# Patient Record
Sex: Male | Born: 1945 | Race: White | Hispanic: No | Marital: Married | Smoking: Former smoker
Health system: Southern US, Community
[De-identification: ages and names within clinical notes are randomized; demographics above are authoritative.]

## PROBLEM LIST (undated history)

## (undated) DIAGNOSIS — C61 Malignant neoplasm of prostate: Secondary | ICD-10-CM

## (undated) DIAGNOSIS — I499 Cardiac arrhythmia, unspecified: Secondary | ICD-10-CM

## (undated) DIAGNOSIS — IMO0002 Reserved for concepts with insufficient information to code with codable children: Secondary | ICD-10-CM

## (undated) DIAGNOSIS — E785 Hyperlipidemia, unspecified: Secondary | ICD-10-CM

## (undated) DIAGNOSIS — K219 Gastro-esophageal reflux disease without esophagitis: Secondary | ICD-10-CM

## (undated) DIAGNOSIS — I10 Essential (primary) hypertension: Secondary | ICD-10-CM

## (undated) DIAGNOSIS — F329 Major depressive disorder, single episode, unspecified: Secondary | ICD-10-CM

## (undated) DIAGNOSIS — F32A Depression, unspecified: Secondary | ICD-10-CM

## (undated) HISTORY — DX: Essential (primary) hypertension: I10

## (undated) HISTORY — PX: PROSTATE BIOPSY: SHX241

## (undated) HISTORY — DX: Gastro-esophageal reflux disease without esophagitis: K21.9

## (undated) HISTORY — DX: Cardiac arrhythmia, unspecified: I49.9

## (undated) HISTORY — DX: Hyperlipidemia, unspecified: E78.5

## (undated) HISTORY — DX: Depression, unspecified: F32.A

## (undated) HISTORY — PX: VASECTOMY: SHX75

## (undated) HISTORY — DX: Major depressive disorder, single episode, unspecified: F32.9

## (undated) HISTORY — DX: Reserved for concepts with insufficient information to code with codable children: IMO0002

---

## 1995-03-07 DIAGNOSIS — K251 Acute gastric ulcer with perforation: Secondary | ICD-10-CM | POA: Insufficient documentation

## 1995-03-07 HISTORY — PX: LAPAROSCOPIC GASTROTOMY W/ REPAIR OF ULCER: SUR772

## 1998-03-06 DIAGNOSIS — Z9889 Other specified postprocedural states: Secondary | ICD-10-CM | POA: Insufficient documentation

## 1999-03-07 HISTORY — PX: HERNIA REPAIR: SHX51

## 2000-02-20 ENCOUNTER — Ambulatory Visit (HOSPITAL_COMMUNITY): Admission: RE | Admit: 2000-02-20 | Discharge: 2000-02-20 | Payer: Self-pay | Admitting: Cardiology

## 2000-02-20 ENCOUNTER — Encounter: Payer: Self-pay | Admitting: Cardiology

## 2000-03-27 ENCOUNTER — Encounter: Payer: Self-pay | Admitting: General Surgery

## 2000-03-28 ENCOUNTER — Encounter (INDEPENDENT_AMBULATORY_CARE_PROVIDER_SITE_OTHER): Payer: Self-pay

## 2000-03-28 ENCOUNTER — Ambulatory Visit (HOSPITAL_COMMUNITY): Admission: RE | Admit: 2000-03-28 | Discharge: 2000-03-28 | Payer: Self-pay | Admitting: General Surgery

## 2003-02-05 ENCOUNTER — Ambulatory Visit (HOSPITAL_COMMUNITY): Admission: RE | Admit: 2003-02-05 | Discharge: 2003-02-05 | Payer: Self-pay | Admitting: Gastroenterology

## 2005-05-29 ENCOUNTER — Ambulatory Visit (HOSPITAL_COMMUNITY): Admission: RE | Admit: 2005-05-29 | Discharge: 2005-05-29 | Payer: Self-pay | Admitting: Cardiology

## 2009-03-06 DIAGNOSIS — K319 Disease of stomach and duodenum, unspecified: Secondary | ICD-10-CM | POA: Insufficient documentation

## 2009-04-30 ENCOUNTER — Ambulatory Visit (HOSPITAL_COMMUNITY): Admission: RE | Admit: 2009-04-30 | Discharge: 2009-04-30 | Payer: Self-pay | Admitting: Gastroenterology

## 2010-04-29 ENCOUNTER — Ambulatory Visit (HOSPITAL_COMMUNITY)
Admission: RE | Admit: 2010-04-29 | Discharge: 2010-04-29 | Disposition: A | Discharge status: Home / Self Care | Payer: Medicare Other | Source: Ambulatory Visit | Attending: Gastroenterology | Admitting: Gastroenterology

## 2010-04-29 ENCOUNTER — Other Ambulatory Visit: Payer: Self-pay | Admitting: Gastroenterology

## 2010-04-29 DIAGNOSIS — K319 Disease of stomach and duodenum, unspecified: Secondary | ICD-10-CM | POA: Insufficient documentation

## 2010-04-29 DIAGNOSIS — E785 Hyperlipidemia, unspecified: Secondary | ICD-10-CM | POA: Insufficient documentation

## 2010-04-29 DIAGNOSIS — Z800 Family history of malignant neoplasm of digestive organs: Secondary | ICD-10-CM | POA: Insufficient documentation

## 2010-04-29 DIAGNOSIS — R1013 Epigastric pain: Secondary | ICD-10-CM | POA: Insufficient documentation

## 2010-04-29 DIAGNOSIS — I10 Essential (primary) hypertension: Secondary | ICD-10-CM | POA: Insufficient documentation

## 2010-04-29 DIAGNOSIS — K219 Gastro-esophageal reflux disease without esophagitis: Secondary | ICD-10-CM | POA: Insufficient documentation

## 2010-07-22 NOTE — Op Note (Signed)
[facility]  Patient:    [name]                        MRN: [id number] Proc. Date: 03/28/00 Attending:  Chevis Pretty, M.D.                           Operative Report  PREOPERATIVE DIAGNOSIS:  Right inguinal hernia.  POSTOPERATIVE DIAGNOSIS:  Right combined direct and indirect inguinal hernia.  OPERATION:  Right inguinal hernia repair with mesh.  SURGEON:  Chevis Pretty, M.D.  ANESTHESIA:  General endotracheal anesthesia.  DESCRIPTION OF PROCEDURE:  After informed consent was obtained, the patient was brought to the operating room and placed in supine position on the operating table.  After adequate induction of general anesthesia, the patients abdomen and right groin were prepped with Betadine and draped in the usual sterile manner.  An incision was made from the edge of the pubic tubercle towards the anterior superior iliac spine for a distance of approximately 7 or 8 cm.  This incision was carried down through the rest of the skin and subcutaneous tissue with the Bovie electrocautery until the fascia of the anterior abdominal wall was encountered.  Once small vein was encountered in the subcutaneous tissue that required serially clamping with hemostats, cutting, and ligating with 3-0 silk ties.  The anterior surface of the external oblique muscle aponeurosis was cleared of any subcutaneous attachments, and the external oblique layer was incised along its fibers with the #15 blade knife.  A pair of Metzenbaum scissors were inserted underneath the fascial layer to ensure that the ilioinguinal nerve was not adherent to this layer.  Then the external oblique was opened to the apex of the external ring.  The cord was bluntly cleared from any attachments to the external oblique layer, and the ilioinguinal nerve was separated from the cord structures and held out of the working field.  The cord was the surrounded at the pubic tubercle between two  fingers, and a 1/4 inch Penrose as placed around the cord structures for retraction purposes.  There was an obvious defect of the floor of the canal through which some preperitoneal fat was bulging.  The cord structures were identified and skeletonized.  There was also an indirect small hernia sac with the cord that was separated from the rest of the cord structures.  Care was taken to stay away from the vas deferens.  The indirect hernia sac was opened, and no visceral components were within it.  It was then ligated near its base with 2-0 silk suture ligature and dropped back in the abdomen.  The preperitoneal space through the floor of the canal was then dissected in a blunt manner with some finger dissection as well as by inserting an sponge into the space to open up enough area for a piece of mesh.  A two-layer piece of Prolene mesh was then inserted with the deeper layer being placed in the preperitoneal space spread out in a radial manner.  Care was taken to make sure that this deeper layer of Prolene overlapped the pubic tubercle medially.  The superficial layer of mesh was then sewed inferiorly to the shaven edge of the inguinal ligament using a running 2-0 Prolene stitch.  Superiorly, the superficial layer of mesh was sewn to the aponeurotic muscular layer of the transversalis with another running 2-0 Prolene.  The lateral portion of the mesh  was cut in a longitudinal manner, and the tails were placed around the cord structures and lateral.  The tails were anchored to the shaven edge of the inguinal ligament.  Care was taken to make sure the opening for the cord was not too tight.  The ilioinguinal nerve was placed on top of the mesh and cord.  The external oblique was then reapproximated with a running 2-0 Prolene stitch, taking care not to catch the nerve within the stitch.  The wound was then irrigated with copious amounts of saline.  The Camper fascia was then reapproximated  with several 3-0 Vicryl interrupted stitches, and the skin was closed with 4-0 Monocryl subcuticular stitches.  Benzoin and Steri-Strips were applied, and a sterile dressing was placed on top of this.  The patient tolerated the procedure well.  At the end of the case, all needle, sponge, and instrument counts were correct.  The patient was awakened and taken to the recovery room in stable condition. DD:  03/28/00 TD:  03/28/00 Job: [id number]

## 2010-07-22 NOTE — Op Note (Signed)
NAME:  [name], [name]                           ACCOUNT NO.:  [id number]   MEDICAL RECORD NO.:  [id number]                   PATIENT TYPE:  AMB   LOCATION:  ENDO                                 FACILITY:   PHYSICIAN:  Anselmo Rod, M.D.               DATE OF BIRTH:  [birth year]   DATE OF PROCEDURE:  02/05/2003  DATE OF DISCHARGE:                                 OPERATIVE REPORT   PROCEDURE PERFORMED:  Screening colonoscopy.   ENDOSCOPIST:  Anselmo Rod, M.D.   INSTRUMENT USED:  Olympus video colonoscope.   INDICATIONS FOR PROCEDURE:  A 65 year old white male with a family history  of colon cancer in a paternal grandmother to rule out colonic polyps,  masses, etc.   PREPROCEDURE PREPARATION:  Informed consent was procured from the patient.  The patient was fasted for eight hours prior to procedure and prepped with a  bottle of magnesium citrate and gallon of GoLYTELY the night prior to  procedure.   PREPROCEDURE PHYSICAL:  VITAL SIGNS:  The patient has stable vital signs.  NECK:  Supple.  CHEST:  Clear to auscultation.  CARDIAC:  S1, S2 regular.  ABDOMEN:  Soft with normal bowel sounds.   DESCRIPTION OF PROCEDURE:  The patient was placed in the left lateral  decubitus position and sedated with 60 mg of Demerol and 6 mg of Versed  intravenously. Once the patient was adequately sedated and maintained on low-  flow oxygen and continuous cardiac monitoring, the Olympus video colonoscope  was advanced from the rectum to the cecum.  The appendicular orifice and  ileocecal valve were visualized and photographed.  The terminal ileum  appeared normal and without lesions.  Small internal hemorrhoids were seen  on retroflexion.  No masses, polyps or diverticular were appreciated.   IMPRESSION:  1. Normal colonoscopy up to the terminal ileum except for internal     hemorrhoids.  2. Some residual stool in the colon, multiple washes done.   RECOMMENDATIONS:  1. Considering the  patient's family history of colon cancer, repeat     colonoscopy has been recommended in the next five years unless the     patient develops any abnormal symptoms in the interim.  2. Outpatient followup as it arises in the future.                                               Anselmo Rod, M.D.    JNM/MEDQ  D:  02/05/2003  T:  02/06/2003  Job:  [id number]   cc:   Gabriel Earing, M.D.  [address]  [place]  [place] [address]  Fax: [phone]

## 2010-09-22 ENCOUNTER — Encounter: Payer: Self-pay | Admitting: Family Medicine

## 2010-09-22 ENCOUNTER — Ambulatory Visit (INDEPENDENT_AMBULATORY_CARE_PROVIDER_SITE_OTHER): Payer: Medicare Other | Admitting: Family Medicine

## 2010-09-22 VITALS — BP 126/88 | HR 66 | Temp 98.3°F | Ht 72.75 in | Wt 207.6 lb

## 2010-09-22 DIAGNOSIS — Z0000 Encounter for general adult medical examination without abnormal findings: Secondary | ICD-10-CM | POA: Insufficient documentation

## 2010-09-22 DIAGNOSIS — E785 Hyperlipidemia, unspecified: Secondary | ICD-10-CM

## 2010-09-22 DIAGNOSIS — E049 Nontoxic goiter, unspecified: Secondary | ICD-10-CM

## 2010-09-22 DIAGNOSIS — Z23 Encounter for immunization: Secondary | ICD-10-CM

## 2010-09-22 DIAGNOSIS — R17 Unspecified jaundice: Secondary | ICD-10-CM

## 2010-09-22 DIAGNOSIS — I10 Essential (primary) hypertension: Secondary | ICD-10-CM

## 2010-09-22 DIAGNOSIS — K219 Gastro-esophageal reflux disease without esophagitis: Secondary | ICD-10-CM

## 2010-09-22 LAB — HEPATIC FUNCTION PANEL
ALT: 28 U/L (ref 0–53)
AST: 25 U/L (ref 0–37)
Bilirubin, Direct: 0.1 mg/dL (ref 0.0–0.3)
Total Bilirubin: 0.7 mg/dL (ref 0.3–1.2)

## 2010-09-22 LAB — BASIC METABOLIC PANEL
Chloride: 104 mEq/L (ref 96–112)
Creatinine, Ser: 1 mg/dL (ref 0.4–1.5)
Potassium: 4.3 mEq/L (ref 3.5–5.1)

## 2010-09-22 MED ORDER — ZOSTER VACCINE LIVE 19400 UNT/0.65ML ~~LOC~~ SOLR: 0.6500 mL | Freq: Once | SUBCUTANEOUS | Status: DC

## 2010-09-22 NOTE — Assessment & Plan Note (Signed)
ghm utd Labs reviewed with pt 

## 2010-09-22 NOTE — Progress Notes (Signed)
  Subjective:    Patient ID: [name], male    DOB: 1945-05-02, 65 y.o.   MRN: [id number]  HPI Pt is here for cpe.  No complaints.  Dr Sharyn Lull is his cardiologist and he rx most of his meds.  Dr Loreta Ave is his GI doctor and his colon was Dec 2011--he said it was fine but has them q5y.      Review of Systems .  Review of Systems  Constitutional: Negative for activity change, appetite change and fatigue.  HENT: Negative for hearing loss, congestion, tinnitus and ear discharge.  No dentist. Eyes: Negative for visual disturbance (optho -- 5 years ago) Respiratory: Negative for cough, chest tightness and shortness of breath.   Cardiovascular: Negative for chest pain, palpitations and leg swelling.  Gastrointestinal: Negative for abdominal pain, diarrhea, constipation and abdominal distention.  Genitourinary: Negative for urgency, frequency, decreased urine volume and difficulty urinating.  Musculoskeletal: Negative for back pain, arthralgias and gait problem.  Skin: Negative for color change, pallor and rash.  Neurological: Negative for dizziness, light-headedness, numbness and headaches.  Hematological: Negative for adenopathy. Does not bruise/bleed easily.  Psychiatric/Behavioral: Negative for suicidal ideas, confusion, sleep disturbance, self-injury, dysphoric mood, decreased concentration and agitation.  Pt is able to read and write and can do all ADLs No risk for falling No abuse/ violence in home   Objective:   Physical Exam BP 126/88  Pulse 66  Temp(Src) 98.3 F (36.8 C) (Oral)  Ht 6' 0.75" (1.848 m)  Wt 207 lb 9.6 oz (94.167 kg)  BMI 27.58 kg/m2  SpO2 97%  General Appearance:    Alert, cooperative, no distress, appears stated age  Head:    Normocephalic, without obvious abnormality, atraumatic  Eyes:    PERRL, conjunctiva/corneas clear, EOM's intact, fundi    benign, both eyes       Ears:    Normal TM's and external ear canals, both ears  Nose:   Nares normal, septum  midline, mucosa normal, no drainage   or sinus tenderness  Throat:   Lips, mucosa, and tongue normal; teeth and gums normal  Neck:   Supple, symmetrical, trachea midline, no adenopathy;       thyroid:  No enlargement/tenderness/nodules; no carotid   bruit or JVD  Back:     Symmetric, no curvature, ROM normal, no CVA tenderness  Lungs:     Clear to auscultation bilaterally, respirations unlabored  Chest wall:    No tenderness or deformity  Heart:    Regular rate and rhythm, S1 and S2 normal, no murmur, rub   or gallop  Abdomen:     Soft, non-tender, bowel sounds active all four quadrants,    no masses, no organomegaly  Genitalia:    Normal male without lesion, discharge or tenderness  Rectal:    Normal tone, normal prostate, no masses or tenderness;   guaiac negative stool  Extremities:   Extremities normal, atraumatic, no cyanosis or edema  Pulses:   2+ and symmetric all extremities  Skin:   Skin color, texture, turgor normal, no rashes or lesions  Lymph nodes:   Cervical, supraclavicular, and axillary nodes normal  Neurologic:   CNII-XII intact. Normal strength, sensation and reflexes      throughout          Assessment & Plan:

## 2010-09-22 NOTE — Assessment & Plan Note (Signed)
Per cardio 

## 2010-09-22 NOTE — Assessment & Plan Note (Signed)
I did not appreciated it but pt worried about it We will recheck hep function

## 2010-09-22 NOTE — Assessment & Plan Note (Signed)
con't dexilant Per GI

## 2010-09-22 NOTE — Patient Instructions (Signed)
Preventative Care for Adults - Male Studies show that half of deaths in the [place] today result from unhealthy lifestyle practices. This includes ignoring preventive care suggestions. Preventive health guidelines for women include the following key practices:  A routine yearly physical is a good way to check with your primary caregiver about your health and preventive screening. It is a chance to share any concerns and updates on your health, and to receive a thorough all-over exam.   If you smoke cigarettes, find out from your caregiver how to quit. It can literally save your life, no matter how long you have been a tobacco user. If you do not use tobacco, never start.   Maintain a healthy diet and normal weight. Increased weight leads to problems with blood pressure and diabetes. Decrease saturated fat in your diet and increase regular exercise. Eat a variety of foods, including fruit, vegetables, animal or vegetable protein (meat, fish, chicken, and eggs, or beans, lentils, and tofu), and grains, such as rice. Get information about proper diet from your caregiver, if needed.   Aerobic exercise helps maintain good heart health. The CDC and the American College of Sports Medicine recommend 30 minutes of moderate-intensity exercise (a brisk walk that increases your heart rate and breathing) on most days of the week. Ongoing high blood pressure should be treated with medicines, if weight loss and exercise are not effective.   Avoid smoking, drinking too much alcohol (more than two drinks per day), and use of street drugs. Do not share needles with anyone. Ask for professional help if you need support or instructions about stopping the use of alcohol, cigarettes, or drugs.   Maintain normal blood lipids and cholesterol, by minimizing your intake of saturated fat. Eat a well rounded diet, with plenty of fruit and vegetables. The National Institutes of Health encourage women to eat 5-9 servings of  fruit and vegetables each day. Your caregiver can give instructions to help you keep your risk of heart disease or stroke low. High blood pressure causes heart disease and increases risk of stroke. Blood pressure should be checked every 1-2 years, from age 20 onward.   Blood tests for high cholesterol, which causes heart and vessel disease, should begin at age 20 and be repeated every 5 years, if test results are normal. (Repeat tests more often if results are high.)   Diabetes screening involves taking a blood sample to check your blood sugar level, after a fasting period. This is done once every 3 years, after age 45, if test results are normal.   Breast cancer screening is essential to preventive care for women. All women age 20 and older should perform a breast self-exam every month. At age 40 and older, women should have their caregiver complete a breast exam each year. Women at ages 40-50 should have a mammogram (x-ray film) of the breasts each year. Your caregiver can discuss when to start your yearly mammograms.   Cervical cancer screening includes taking a Pap smear (sample of cells examined under a microscope) from the cervix (end of the uterus). It also includes testing for HPV (Human Papilloma Virus, which can cause cervical cancer). Screening and a pelvic exam should begin at age 21, or 3 years after a woman becomes sexually active. Screening should occur every year, with a Pap smear but no HPV testing, up to age 30. After age 30, you should have a Pap smear every 3 years with HPV testing, if no HPV was found previously.     Colon cancer can be detected, and often prevented, long before it is life threatening. Most routine colon cancer screening begins at the age of 50. On a yearly basis, your caregiver may provide easy-to-use take-home tests to check for hidden blood in the stool. Use of a small camera at the end of a tube, to directly examine the colon (sigmoidoscopy or colonoscopy), can  detect the earliest forms of colon cancer and can be life saving. Talk to your caregiver about this at age 50, when routine screening begins. (Screening is repeated every 5 years, unless early forms of pre-cancerous polyps or small growths are found.)   Practice safe sex. Use condoms. Condoms are used for birth control and to reduce the spread of sexually transmitted infections (STIs). Unsafe sex is sexual activity without the use of safeguards, such as condoms and avoidance of high-risk acts, to reduce the chances of getting or spreading STIs. STIs include gonorrhea (the clap), chlamydia, syphilis, trichimonas, herpes, HPV (human papilloma virus) and HIV (human immunodeficiency virus), which causes AIDS. Herpes, HIV, and HPV are viral illnesses that have no cure. They can result in disability, cancer, and death.   HPV causes cancer of the cervix, and other infections that can be transmitted from person to person. There is a vaccine for HPV, and females should get immunized between the ages of 11 and 26. It requires a series of 3 shots.   Osteoporosis is a disease in which the bones lose minerals and strength as we age. This can result in serious bone fractures. Risk of osteoporosis can be identified using a bone density scan. Women ages 65 and over should discuss this with their caregivers, as should women after menopause who have other risk factors. Ask your caregiver whether you should be taking a calcium supplement and Vitamin D, to reduce the rate of osteoporosis.   Menopause can be associated with physical symptoms and risks. Hormone replacement therapy is available to decrease these. You should talk to your caregiver about whether starting or continuing to take hormones is right for you.   Use sunscreen with SPF (skin protection factor) of 15 or more. Apply sunscreen liberally and repeatedly throughout the day. Being outside in the sun, when your shadow is shorter than you are, means you are being  exposed to sun at greater intensity. Lighter skinned people are at a greater risk of skin cancer. Wear sunglasses, to protect your eyes from too much damaging sunlight (which can speed up cataract formation).   Once a month, do a whole body skin exam or review, using a mirror to look at your back. Notify your caregiver of changes in moles, especially if there are changes in shapes, colors, irregular border, a size larger than a pencil eraser, or new moles develop.   Keep carbon monoxide and smoke detectors in your home, and functioning, at all times. Change the batteries every 6 months, or use a model that plugs into the wall.   Stay up to date with your tetanus shots and other required immunizations. A booster for tetanus should be given every 10 years. Be sure to get your flu shot every year, since 5%-20% of the [place] population comes down with the flu. The composition of the flu vaccine changes each year, so being vaccinated once is not enough. Get your shot in the fall, before the flu season peaks. The table below lists important vaccines to get. Other vaccines to consider include for Hepatitis A virus (to prevent a form of   infection of the liver, by a virus acquired from food), Varicella Zoster (a virus that causes shingles), and Meninogoccal (against bacteria which cause a form of meningitis).   Brush your teeth twice a day with fluoride toothpaste, and floss once a day. Good oral hygiene prevents tooth decay and gum disease, which can be painful and can cause other health problems. Visit your dentist for a routine oral and dental check up and preventive care every 6-12 months.   The Body Mass Index or BMI is a way of measuring how much of your body is fat. Having a BMI above 27 increases the risk of heart disease, diabetes, hypertension, stroke and other problems related to obesity. Your caregiver can help determine your BMI, and can develop an exercise and dietary program to help you achieve or  maintain this measurement at a healthy level.   Wear seat belts whenever you are in a vehicle, whether as passenger or driver, and even for short drives of a few minutes.   If you bicycle, wear a helmet at all times.  Preventative Care for Adult Women  Preventative Services Ages 19-39 Ages 40-64 Ages 65 and over  Health risk assessment and lifestyle counseling.     Blood pressure check.** Every 1-2 years Every 1-2 years Every 1-2 years  Total cholesterol check including HDL.** Every 5 years beginning at age 35 Every 5 years beginning at age 20, or more often if risk is high Every 5 years through age 75, then optional  Breast self exam. Monthly in all women ages 20 and older Monthly Monthly  Clinical breast exam.** Every 3 years beginning at age 20 Every year Every year  Mammogram.**  Every year beginning at age 50, optional from age 40-49 (discuss with your caregiver). Every year until age 75, then optional  Pap Smear** and HPV Screening. Every year from ages 21 through 29 Every 3 years from ages 30 through 65, if HPV is negative Optional; talk with your caregiver  Flexible sigmoidoscopy** or colonoscopy.**   Every 5 years beginning at age 50 Every 5 years until age 80; then optional  FOBT (fecal occult blood test) of stool.  Every year beginning at age 50 Every year until 80; then optional  Skin self-exam. Monthly Monthly Monthly  Tetanus-diphtheria (Td) immunization. Every 10 years Every 10 years Every 10 years  Influenza immunization.** Every year Every year Every year  HPV immunization. Once between the ages of 11 and 26     Pneumococcal immunization.** Optional Optional Every 5 years  Hepatitis B immunization.** Series of 3 immunizations  (if not done previously, usually given at 0, 1 to 2, and 4 to 6 months)  Check with your caregiver, if vaccination not previously given Check with your caregiver, if vaccination not previously given  ** Family history and personal history of risk and  conditions may change your caregiver's recommendations.  Document Released: 04/18/2001 Document Re-Released: 05/17/2009 [employer] Patient Information 2011 [employer]. 

## 2011-03-28 ENCOUNTER — Other Ambulatory Visit: Payer: Self-pay | Admitting: Cardiology

## 2012-02-21 ENCOUNTER — Ambulatory Visit (INDEPENDENT_AMBULATORY_CARE_PROVIDER_SITE_OTHER): Payer: Medicare Other | Admitting: Family Medicine

## 2012-02-21 ENCOUNTER — Encounter: Payer: Self-pay | Admitting: Family Medicine

## 2012-02-21 VITALS — BP 122/80 | HR 69 | Temp 98.3°F | Ht 72.0 in | Wt 196.4 lb

## 2012-02-21 DIAGNOSIS — F329 Major depressive disorder, single episode, unspecified: Secondary | ICD-10-CM

## 2012-02-21 DIAGNOSIS — E785 Hyperlipidemia, unspecified: Secondary | ICD-10-CM

## 2012-02-21 DIAGNOSIS — F4310 Post-traumatic stress disorder, unspecified: Secondary | ICD-10-CM

## 2012-02-21 DIAGNOSIS — N529 Male erectile dysfunction, unspecified: Secondary | ICD-10-CM

## 2012-02-21 DIAGNOSIS — Z0000 Encounter for general adult medical examination without abnormal findings: Secondary | ICD-10-CM

## 2012-02-21 DIAGNOSIS — F32A Depression, unspecified: Secondary | ICD-10-CM | POA: Insufficient documentation

## 2012-02-21 DIAGNOSIS — I10 Essential (primary) hypertension: Secondary | ICD-10-CM

## 2012-02-21 LAB — CBC WITH DIFFERENTIAL/PLATELET
Basophils Absolute: 0 10*3/uL (ref 0.0–0.1)
Basophils Relative: 0.2 % (ref 0.0–3.0)
Eosinophils Relative: 1.5 % (ref 0.0–5.0)
HCT: 45.7 % (ref 39.0–52.0)
Hemoglobin: 15.6 g/dL (ref 13.0–17.0)
Lymphs Abs: 1.7 10*3/uL (ref 0.7–4.0)
Monocytes Relative: 7.2 % (ref 3.0–12.0)
Neutro Abs: 3.2 10*3/uL (ref 1.4–7.7)
RDW: 12.7 % (ref 11.5–14.6)

## 2012-02-21 LAB — PSA: PSA: 2.18 ng/mL (ref 0.10–4.00)

## 2012-02-21 MED ORDER — TADALAFIL 5 MG PO TABS
ORAL_TABLET | ORAL | Status: DC
Start: 2012-02-21 — End: 2012-03-18

## 2012-02-21 MED ORDER — SERTRALINE HCL 50 MG PO TABS: 50.0000 mg | ORAL_TABLET | Freq: Every day | ORAL | Status: DC

## 2012-02-21 NOTE — Assessment & Plan Note (Signed)
zoloft 50 mg

## 2012-02-21 NOTE — Assessment & Plan Note (Signed)
Per cards  

## 2012-02-21 NOTE — Assessment & Plan Note (Signed)
zoloft rto 1 month

## 2012-02-21 NOTE — Patient Instructions (Signed)
Preventive Care for Adults, Male A healthy lifestyle and preventive care can promote health and wellness. Preventive health guidelines for men include the following key practices:  A routine yearly physical is a good way to check with your caregiver about your health and preventative screening. It is a chance to share any concerns and updates on your health, and to receive a thorough exam.  Visit your dentist for a routine exam and preventative care every 6 months. Brush your teeth twice a day and floss once a day. Good oral hygiene prevents tooth decay and gum disease.  The frequency of eye exams is based on your age, health, family medical history, use of contact lenses, and other factors. Follow your caregiver's recommendations for frequency of eye exams.  Eat a healthy diet. Foods like vegetables, fruits, whole grains, low-fat dairy products, and lean protein foods contain the nutrients you need without too many calories. Decrease your intake of foods high in solid fats, added sugars, and salt. Eat the right amount of calories for you.Get information about a proper diet from your caregiver, if necessary.  Regular physical exercise is one of the most important things you can do for your health. Most adults should get at least 150 minutes of moderate-intensity exercise (any activity that increases your heart rate and causes you to sweat) each week. In addition, most adults need muscle-strengthening exercises on 2 or more days a week.  Maintain a healthy weight. The body mass index (BMI) is a screening tool to identify possible weight problems. It provides an estimate of body fat based on height and weight. Your caregiver can help determine your BMI, and can help you achieve or maintain a healthy weight.For adults 20 years and older:  A BMI below 18.5 is considered underweight.  A BMI of 18.5 to 24.9 is normal.  A BMI of 25 to 29.9 is considered overweight.  A BMI of 30 and above is  considered obese.  Maintain normal blood lipids and cholesterol levels by exercising and minimizing your intake of saturated fat. Eat a balanced diet with plenty of fruit and vegetables. Blood tests for lipids and cholesterol should begin at age 20 and be repeated every 5 years. If your lipid or cholesterol levels are high, you are over 50, or you are a high risk for heart disease, you may need your cholesterol levels checked more frequently.Ongoing high lipid and cholesterol levels should be treated with medicines if diet and exercise are not effective.  If you smoke, find out from your caregiver how to quit. If you do not use tobacco, do not start.  If you choose to drink alcohol, do not exceed 2 drinks per day. One drink is considered to be 12 ounces (355 mL) of beer, 5 ounces (148 mL) of wine, or 1.5 ounces (44 mL) of liquor.  Avoid use of street drugs. Do not share needles with anyone. Ask for help if you need support or instructions about stopping the use of drugs.  High blood pressure causes heart disease and increases the risk of stroke. Your blood pressure should be checked at least every 1 to 2 years. Ongoing high blood pressure should be treated with medicines, if weight loss and exercise are not effective.  If you are 45 to 67 years old, ask your caregiver if you should take aspirin to prevent heart disease.  Diabetes screening involves taking a blood sample to check your fasting blood sugar level. This should be done once every 3 years,   after age 45, if you are within normal weight and without risk factors for diabetes. Testing should be considered at a younger age or be carried out more frequently if you are overweight and have at least 1 risk factor for diabetes.  Colorectal cancer can be detected and often prevented. Most routine colorectal cancer screening begins at the age of 50 and continues through age 75. However, your caregiver may recommend screening at an earlier age if you  have risk factors for colon cancer. On a yearly basis, your caregiver may provide home test kits to check for hidden blood in the stool. Use of a small camera at the end of a tube, to directly examine the colon (sigmoidoscopy or colonoscopy), can detect the earliest forms of colorectal cancer. Talk to your caregiver about this at age 50, when routine screening begins. Direct examination of the colon should be repeated every 5 to 10 years through age 75, unless early forms of pre-cancerous polyps or small growths are found.  Hepatitis C blood testing is recommended for all people born from 1945 through 1965 and any individual with known risks for hepatitis C.  Practice safe sex. Use condoms and avoid high-risk sexual practices to reduce the spread of sexually transmitted infections (STIs). STIs include gonorrhea, chlamydia, syphilis, trichomonas, herpes, HPV, and human immunodeficiency virus (HIV). Herpes, HIV, and HPV are viral illnesses that have no cure. They can result in disability, cancer, and death.  A one-time screening for abdominal aortic aneurysm (AAA) and surgical repair of large AAAs by sound wave imaging (ultrasonography) is recommended for ages 65 to 75 years who are current or former smokers.  Healthy men should no longer receive prostate-specific antigen (PSA) blood tests as part of routine cancer screening. Consult with your caregiver about prostate cancer screening.  Testicular cancer screening is not recommended for adult males who have no symptoms. Screening includes self-exam, caregiver exam, and other screening tests. Consult with your caregiver about any symptoms you have or any concerns you have about testicular cancer.  Use sunscreen with skin protection factor (SPF) of 30 or more. Apply sunscreen liberally and repeatedly throughout the day. You should seek shade when your shadow is shorter than you. Protect yourself by wearing long sleeves, pants, a wide-brimmed hat, and  sunglasses year round, whenever you are outdoors.  Once a month, do a whole body skin exam, using a mirror to look at the skin on your back. Notify your caregiver of new moles, moles that have irregular borders, moles that are larger than a pencil eraser, or moles that have changed in shape or color.  Stay current with required immunizations.  Influenza. You need a dose every fall (or winter). The composition of the flu vaccine changes each year, so being vaccinated once is not enough.  Pneumococcal polysaccharide. You need 1 to 2 doses if you smoke cigarettes or if you have certain chronic medical conditions. You need 1 dose at age 65 (or older) if you have never been vaccinated.  Tetanus, diphtheria, pertussis (Tdap, Td). Get 1 dose of Tdap vaccine if you are younger than age 65 years, are over 65 and have contact with an infant, are a healthcare worker, or simply want to be protected from whooping cough. After that, you need a Td booster dose every 10 years. Consult your caregiver if you have not had at least 3 tetanus and diphtheria-containing shots sometime in your life or have a deep or dirty wound.  HPV. This vaccine is recommended   for males 13 through 67 years of age. This vaccine may be given to men 22 through 67 years of age who have not completed the 3 dose series. It is recommended for men through age 26 who have sex with men or whose immune system is weakened because of HIV infection, other illness, or medications. The vaccine is given in 3 doses over 6 months.  Measles, mumps, rubella (MMR). You need at least 1 dose of MMR if you were born in 1957 or later. You may also need a 2nd dose.  Meningococcal. If you are age 19 to 21 years and a first-year college student living in a residence hall, or have one of several medical conditions, you need to get vaccinated against meningococcal disease. You may also need additional booster doses.  Zoster (shingles). If you are age 60 years or  older, you should get this vaccine.  Varicella (chickenpox). If you have never had chickenpox or you were vaccinated but received only 1 dose, talk to your caregiver to find out if you need this vaccine.  Hepatitis A. You need this vaccine if you have a specific risk factor for hepatitis A virus infection, or you simply wish to be protected from this disease. The vaccine is usually given as 2 doses, 6 to 18 months apart.  Hepatitis B. You need this vaccine if you have a specific risk factor for hepatitis B virus infection or you simply wish to be protected from this disease. The vaccine is given in 3 doses, usually over 6 months. Preventative Service / Frequency Ages 19 to 39  Blood pressure check.** / Every 1 to 2 years.  Lipid and cholesterol check.** / Every 5 years beginning at age 20.  Hepatitis C blood test.** / For any individual with known risks for hepatitis C.  Skin self-exam. / Monthly.  Influenza immunization.** / Every year.  Pneumococcal polysaccharide immunization.** / 1 to 2 doses if you smoke cigarettes or if you have certain chronic medical conditions.  Tetanus, diphtheria, pertussis (Tdap,Td) immunization. / A one-time dose of Tdap vaccine. After that, you need a Td booster dose every 10 years.  HPV immunization. / 3 doses over 6 months, if 26 and younger.  Measles, mumps, rubella (MMR) immunization. / You need at least 1 dose of MMR if you were born in 1957 or later. You may also need a 2nd dose.  Meningococcal immunization. / 1 dose if you are age 19 to 21 years and a first-year college student living in a residence hall, or have one of several medical conditions, you need to get vaccinated against meningococcal disease. You may also need additional booster doses.  Varicella immunization.** / Consult your caregiver.  Hepatitis A immunization.** / Consult your caregiver. 2 doses, 6 to 18 months apart.  Hepatitis B immunization.** / Consult your caregiver. 3 doses  usually over 6 months. Ages 40 to 64  Blood pressure check.** / Every 1 to 2 years.  Lipid and cholesterol check.** / Every 5 years beginning at age 20.  Fecal occult blood test (FOBT) of stool. / Every year beginning at age 50 and continuing until age 75. You may not have to do this test if you get colonoscopy every 10 years.  Flexible sigmoidoscopy** or colonoscopy.** / Every 5 years for a flexible sigmoidoscopy or every 10 years for a colonoscopy beginning at age 50 and continuing until age 75.  Hepatitis C blood test.** / For all people born from 1945 through 1965 and any   individual with known risks for hepatitis C.  Skin self-exam. / Monthly.  Influenza immunization.** / Every year.  Pneumococcal polysaccharide immunization.** / 1 to 2 doses if you smoke cigarettes or if you have certain chronic medical conditions.  Tetanus, diphtheria, pertussis (Tdap/Td) immunization.** / A one-time dose of Tdap vaccine. After that, you need a Td booster dose every 10 years.  Measles, mumps, rubella (MMR) immunization. / You need at least 1 dose of MMR if you were born in 1957 or later. You may also need a 2nd dose.  Varicella immunization.**/ Consult your caregiver.  Meningococcal immunization.** / Consult your caregiver.  Hepatitis A immunization.** / Consult your caregiver. 2 doses, 6 to 18 months apart.  Hepatitis B immunization.** / Consult your caregiver. 3 doses, usually over 6 months. Ages 65 and over  Blood pressure check.** / Every 1 to 2 years.  Lipid and cholesterol check.**/ Every 5 years beginning at age 20.  Fecal occult blood test (FOBT) of stool. / Every year beginning at age 50 and continuing until age 75. You may not have to do this test if you get colonoscopy every 10 years.  Flexible sigmoidoscopy** or colonoscopy.** / Every 5 years for a flexible sigmoidoscopy or every 10 years for a colonoscopy beginning at age 50 and continuing until age 75.  Hepatitis C blood  test.** / For all people born from 1945 through 1965 and any individual with known risks for hepatitis C.  Abdominal aortic aneurysm (AAA) screening.** / A one-time screening for ages 65 to 75 years who are current or former smokers.  Skin self-exam. / Monthly.  Influenza immunization.** / Every year.  Pneumococcal polysaccharide immunization.** / 1 dose at age 65 (or older) if you have never been vaccinated.  Tetanus, diphtheria, pertussis (Tdap, Td) immunization. / A one-time dose of Tdap vaccine if you are over 65 and have contact with an infant, are a healthcare worker, or simply want to be protected from whooping cough. After that, you need a Td booster dose every 10 years.  Varicella immunization. ** / Consult your caregiver.  Meningococcal immunization.** / Consult your caregiver.  Hepatitis A immunization. ** / Consult your caregiver. 2 doses, 6 to 18 months apart.  Hepatitis B immunization.** / Check with your caregiver. 3 doses, usually over 6 months. **Family history and personal history of risk and conditions may change your caregiver's recommendations. Document Released: 04/18/2001 Document Revised: 05/15/2011 Document Reviewed: 07/18/2010 [employer] Patient Information 2013 [employer].  

## 2012-02-21 NOTE — Progress Notes (Signed)
Subjective:    [name] is a 66 y.o. male who presents for Medicare Annual/Subsequent preventive examination.   Preventive Screening-Counseling & Management  Tobacco History  Smoking status  . Former Smoker  . Types: Cigarettes  . Quit date: 09/22/1970  Smokeless tobacco  . Never Used    Problems Prior to Visit 1. Depression--PTSD  Current Problems (verified) Patient Active Problem List  Diagnosis  . Icterus  . Preventative health care  . Hyperlipidemia  . HTN (hypertension)  . GERD (gastroesophageal reflux disease)    Medications Prior to Visit Current Outpatient Prescriptions on File Prior to Visit  Medication Sig Dispense Refill  . aspirin 81 MG tablet Take 81 mg by mouth daily.        Marland Kitchen atorvastatin (LIPITOR) 40 MG tablet Take 40 mg by mouth daily.        . fish oil-omega-3 fatty acids 1000 MG capsule Take 1 g by mouth daily.        . metoprolol (LOPRESSOR) 50 MG tablet Take 50 mg by mouth 2 (two) times daily.        . Multiple Vitamin (MULTIVITAMIN) tablet Take 1 tablet by mouth daily.        Marland Kitchen NITROSTAT 0.4 MG SL tablet       . omeprazole (PRILOSEC) 20 MG capsule Take 1 capsule by mouth Daily.        Current Medications (verified) Current Outpatient Prescriptions  Medication Sig Dispense Refill  . aspirin 81 MG tablet Take 81 mg by mouth daily.        Marland Kitchen atorvastatin (LIPITOR) 40 MG tablet Take 40 mg by mouth daily.        . fish oil-omega-3 fatty acids 1000 MG capsule Take 1 g by mouth daily.        . metoprolol (LOPRESSOR) 50 MG tablet Take 50 mg by mouth 2 (two) times daily.        . Multiple Vitamin (MULTIVITAMIN) tablet Take 1 tablet by mouth daily.        Marland Kitchen NITROSTAT 0.4 MG SL tablet       . omeprazole (PRILOSEC) 20 MG capsule Take 1 capsule by mouth Daily.         Allergies (verified) Codeine and Penicillins   PAST HISTORY  Family History Family History  Problem Relation Age of Onset  . Arthritis Mother   . Hypertension Mother   .  Heart disease Father     CHF  . Hypertension Father   . Diverticulitis Father   . Hypertension Brother   . Diabetes Maternal Grandmother   . Colon cancer Paternal Grandmother   . Hyperlipidemia Brother   . Hyperlipidemia Sister   . Hyperlipidemia Brother   . Hypertension Brother   . Heart disease Brother 73    quadruple bypass, MI  . Diverticulitis Brother     Social History History  Substance Use Topics  . Smoking status: Former Smoker    Types: Cigarettes    Quit date: 09/22/1970  . Smokeless tobacco: Never Used  . Alcohol Use: No    Are there smokers in your home (other than you)?  No  Risk Factors Current exercise habits: The patient does not participate in regular exercise at present.  Dietary issues discussed: na   Cardiac risk factors: advanced age (older than 57 for men, 40 for women), dyslipidemia, hypertension, male gender and sedentary lifestyle.  Depression Screen (Note: if answer to either of the following is "Yes", a more complete  depression screening is indicated)   Q1: Over the past two weeks, have you felt down, depressed or hopeless? Yes  Q2: Over the past two weeks, have you felt little interest or pleasure in doing things? Yes  Have you lost interest or pleasure in daily life? No  Do you often feel hopeless? No  Do you cry easily over simple problems? No  Activities of Daily Living In your present state of health, do you have any difficulty performing the following activities?:  Driving? No Managing money?  No Feeding yourself? No Getting from bed to chair? No Climbing a flight of stairs? No Preparing food and eating?: No Bathing or showering? No Getting dressed: No Getting to the toilet? No Using the toilet:No Moving around from place to place: No In the past year have you fallen or had a near fall?:Yes   Are you sexually active?  No  Do you have more than one partner?  No  Hearing Difficulties: No Do you often ask people to speak up  or repeat themselves? No Do you experience ringing or noises in your ears? No Do you have difficulty understanding soft or whispered voices? No   Do you feel that you have a problem with memory? No  Do you often misplace items? No  Do you feel safe at home?  Yes  Cognitive Testing  Alert? Yes  Normal Appearance?Yes  Oriented to person? Yes  Place? Yes   Time? Yes  Recall of three objects?  Yes  Can perform simple calculations? Yes  Displays appropriate judgment?Yes  Can read the correct time from a watch face?Yes   Advanced Directives have been discussed with the patient? Yes   List the Names of Other Physician/Practitioners you currently use: 1.  Cardiology-- [facility] 2  oph-- [facility]   Indicate any recent Medical Services you may have received from other than [facility] providers in the past year (date may be approximate).  Immunization History  Administered Date(s) Administered  . Influenza Whole 12/05/2011  . Pneumococcal Polysaccharide 09/22/2010  . Tdap 11/07/2011  . Zoster 06/14/2011    Screening Tests Health Maintenance  Topic Date Due  . Influenza Vaccine  11/04/2012  . Colonoscopy  02/18/2019  . Tetanus/tdap  11/06/2021  . Pneumococcal Polysaccharide Vaccine Age 64 And Over  Completed  . Zostavax  Completed    All answers were reviewed with the patient and necessary referrals were made:  Loreen Freud, DO   02/21/2012   History reviewed:  He  has a past medical history of GERD (gastroesophageal reflux disease); Ulcer; Arrhythmia; Hypertension; and Hyperlipidemia. He  does not have any pertinent problems on file. He  has past surgical history that includes Hernia repair (2001) and Laparoscopic gastrotomy w/ repair of ulcer (2001). His family history includes Arthritis in his mother; Colon cancer in his paternal grandmother; Diabetes in his maternal grandmother; Diverticulitis in his brother and father; Heart disease in his father; Heart disease (age of onset:73)  in his brother; Hyperlipidemia in his brothers and sister; and Hypertension in his brothers, father, and mother. He  reports that he quit smoking about 41 years ago. His smoking use included Cigarettes. He has never used smokeless tobacco. He reports that he does not drink alcohol or use illicit drugs. He has a current medication list which includes the following prescription(s): aspirin, atorvastatin, fish oil-omega-3 fatty acids, metoprolol, multivitamin, nitrostat, and omeprazole. Current Outpatient Prescriptions on File Prior to Visit  Medication Sig Dispense Refill  . aspirin 81  MG tablet Take 81 mg by mouth daily.        Marland Kitchen atorvastatin (LIPITOR) 40 MG tablet Take 40 mg by mouth daily.        . fish oil-omega-3 fatty acids 1000 MG capsule Take 1 g by mouth daily.        . metoprolol (LOPRESSOR) 50 MG tablet Take 50 mg by mouth 2 (two) times daily.        . Multiple Vitamin (MULTIVITAMIN) tablet Take 1 tablet by mouth daily.        Marland Kitchen NITROSTAT 0.4 MG SL tablet       . omeprazole (PRILOSEC) 20 MG capsule Take 1 capsule by mouth Daily.       He is allergic to codeine and penicillins.  Review of Systems  Review of Systems  Constitutional: Negative for activity change, appetite change and fatigue.  HENT: Negative for hearing loss, congestion, tinnitus and ear discharge.   Eyes: Negative for visual disturbance (see optho q1y -- vision corrected to 20/20 with glasses).  Respiratory: Negative for cough, chest tightness and shortness of breath.   Cardiovascular: Negative for chest pain, palpitations and leg swelling.  Gastrointestinal: Negative for abdominal pain, diarrhea, constipation and abdominal distention.  Genitourinary: Negative for urgency, frequency, decreased urine volume and difficulty urinating.  Musculoskeletal: Negative for back pain, arthralgias and gait problem.  Skin: Negative for color change, pallor and rash.  Neurological: Negative for dizziness, light-headedness, numbness  and headaches.  Hematological: Negative for adenopathy. Does not bruise/bleed easily.  Psychiatric/Behavioral: Negative for suicidal ideas, confusion, sleep disturbance, self-injury, dysphoric mood, decreased concentration and agitation.  Pt is able to read and write and can do all ADLs No risk for falling---had a fall but states he just tripped No abuse/ violence in home     Objective:     Vision by Snellen chart: opth Blood pressure 122/80, pulse 69, temperature 98.3 F (36.8 C), temperature source Oral, height 6' (1.829 m), weight 196 lb 6.4 oz (89.086 kg), SpO2 98.00%. Body mass index is 26.64 kg/(m^2).  BP 122/80  Pulse 69  Temp 98.3 F (36.8 C) (Oral)  Ht 6' (1.829 m)  Wt 196 lb 6.4 oz (89.086 kg)  BMI 26.64 kg/m2  SpO2 98% General appearance: alert, cooperative, appears stated age and no distress Head: Normocephalic, without obvious abnormality, atraumatic Eyes: conjunctivae/corneas clear. PERRL, EOM&#39;s intact. Fundi benign. Ears: normal TM's and external ear canals both ears Nose: Nares normal. Septum midline. Mucosa normal. No drainage or sinus tenderness. Throat: lips, mucosa, and tongue normal; teeth and gums normal Neck: no adenopathy, no carotid bruit, no JVD, supple, symmetrical, trachea midline and thyroid not enlarged, symmetric, no tenderness/mass/nodules Back: symmetric, no curvature. ROM normal. No CVA tenderness. Lungs: clear to auscultation bilaterally Chest wall: no tenderness Heart: regular rate and rhythm, S1, S2 normal, no murmur, click, rub or gallop Abdomen: soft, non-tender; bowel sounds normal; no masses,  no organomegaly Male genitalia: normal, penis: no lesions or discharge. testes: no masses or tenderness. no hernias Rectal: normal tone, normal prostate, no masses or tenderness Extremities: extremities normal, atraumatic, no cyanosis or edema Pulses: 2+ and symmetric Skin: Skin color, texture, turgor normal. No rashes or lesions Lymph nodes:  Cervical, supraclavicular, and axillary nodes normal. Neurologic: Alert and oriented X 3, normal strength and tone. Normal symmetric reflexes. Normal coordination and gait Psych-- no depression, no anxiety     Assessment:    cpe     Plan:     During the course  of the visit the patient was educated and counseled about appropriate screening and preventive services including:    Pneumococcal vaccine   Influenza vaccine  Prostate cancer screening  Colorectal cancer screening  Advanced directives: has an advanced directive - a copy HAS NOT been provided.  Diet review for nutrition referral? Yes ____  Not Indicated __x_   Patient Instructions (the written plan) was given to the patient.  Medicare Attestation I have personally reviewed: The patient's medical and social history Their use of alcohol, tobacco or illicit drugs Their current medications and supplements The patient's functional ability including ADLs,fall risks, home safety risks, cognitive, and hearing and visual impairment Diet and physical activities Evidence for depression or mood disorders  The patient's weight, height, BMI, and visual acuity have been recorded in the chart.  I have made referrals, counseling, and provided education to the patient based on review of the above and I have provided the patient with a written personalized care plan for preventive services.     Loreen Freud, DO   02/21/2012

## 2012-02-21 NOTE — Assessment & Plan Note (Signed)
cialis 5 mg daily

## 2012-02-27 ENCOUNTER — Encounter: Payer: Self-pay | Admitting: *Deleted

## 2012-03-18 ENCOUNTER — Ambulatory Visit (INDEPENDENT_AMBULATORY_CARE_PROVIDER_SITE_OTHER): Payer: Medicare Other | Admitting: Family Medicine

## 2012-03-18 ENCOUNTER — Encounter: Payer: Self-pay | Admitting: Family Medicine

## 2012-03-18 VITALS — BP 120/76 | HR 64 | Wt 199.0 lb

## 2012-03-18 DIAGNOSIS — F329 Major depressive disorder, single episode, unspecified: Secondary | ICD-10-CM

## 2012-03-18 DIAGNOSIS — R1310 Dysphagia, unspecified: Secondary | ICD-10-CM

## 2012-03-18 DIAGNOSIS — F322 Major depressive disorder, single episode, severe without psychotic features: Secondary | ICD-10-CM

## 2012-03-18 DIAGNOSIS — N529 Male erectile dysfunction, unspecified: Secondary | ICD-10-CM

## 2012-03-18 MED ORDER — BUPROPION HCL ER (XL) 300 MG PO TB24: 300.0000 mg | ORAL_TABLET | Freq: Every day | ORAL | Status: DC

## 2012-03-18 MED ORDER — BUPROPION HCL ER (XL) 150 MG PO TB24: ORAL_TABLET | ORAL | Status: DC

## 2012-03-18 MED ORDER — TADALAFIL 5 MG PO TABS: ORAL_TABLET | ORAL | Status: DC

## 2012-03-18 NOTE — Progress Notes (Signed)
  Subjective:    Patient ID: [name], male    DOB: 11-30-1945, 67 y.o.   MRN: [id number]  HPI Pt here f/u depression.  zoloft is causing ED and he would like to change it.  Pt also feels like food gets stuck since taking cialis.   No NV, no chest pain or heartburn.   Review of Systems    as above Objective:   Physical Exam BP 120/76  Pulse 64  Wt 199 lb (90.266 kg)  SpO2 97% General appearance: alert, cooperative, appears stated age and no distress Lungs: clear to auscultation bilaterally Heart: S1, S2 normal Abdomen: soft, non-tender; bowel sounds normal; no masses,  no organomegaly       Assessment & Plan:

## 2012-03-18 NOTE — Assessment & Plan Note (Signed)
Change to wellbutrin xl 150 mg  For 1 week then increase to 2 a day

## 2012-03-18 NOTE — Patient Instructions (Signed)

## 2012-03-18 NOTE — Assessment & Plan Note (Signed)
con't prilosec Consider referral to GI--pt will try to stop cialis since we changed med for depression to see if that helps.

## 2012-03-18 NOTE — Assessment & Plan Note (Signed)
Refill cialis

## 2012-03-20 ENCOUNTER — Other Ambulatory Visit: Payer: Self-pay | Admitting: *Deleted

## 2012-03-20 DIAGNOSIS — N529 Male erectile dysfunction, unspecified: Secondary | ICD-10-CM

## 2012-03-20 MED ORDER — TADALAFIL 5 MG PO TABS: ORAL_TABLET | ORAL | Status: DC

## 2012-03-20 NOTE — Progress Notes (Signed)
Rx resent due original Rx set on no print.

## 2012-04-19 ENCOUNTER — Ambulatory Visit: Payer: Medicare Other | Admitting: Family Medicine

## 2012-04-24 ENCOUNTER — Ambulatory Visit (INDEPENDENT_AMBULATORY_CARE_PROVIDER_SITE_OTHER): Payer: Medicare Other | Admitting: Family Medicine

## 2012-04-24 ENCOUNTER — Encounter: Payer: Self-pay | Admitting: Family Medicine

## 2012-04-24 VITALS — BP 132/78 | HR 74 | Temp 98.6°F | Wt 198.6 lb

## 2012-04-24 DIAGNOSIS — F32A Depression, unspecified: Secondary | ICD-10-CM

## 2012-04-24 DIAGNOSIS — N529 Male erectile dysfunction, unspecified: Secondary | ICD-10-CM

## 2012-04-24 DIAGNOSIS — F329 Major depressive disorder, single episode, unspecified: Secondary | ICD-10-CM

## 2012-04-24 NOTE — Patient Instructions (Signed)

## 2012-04-26 NOTE — Assessment & Plan Note (Signed)
con't meds rto 3 months

## 2012-04-26 NOTE — Progress Notes (Signed)
  Subjective:    Patient ID: [name], male    DOB: 02-Jun-1945, 67 y.o.   MRN: [id number]  HPIpt here f/u depression.  He feels like he is doing better on the meds.  No complaints.    Review of Systems    as ab9ve Objective:   Physical Exam  BP 132/78  Pulse 74  Temp(Src) 98.6 F (37 C) (Oral)  Wt 198 lb 9.6 oz (90.084 kg)  BMI 26.93 kg/m2  SpO2 97% General appearance: alert, cooperative, appears stated age and no distress Psych--flat affect but a little more expressive ,  Not suicidal      Assessment & Plan:

## 2012-04-26 NOTE — Assessment & Plan Note (Signed)
Pt given samples of all 3. He was instructed to use only one at a time

## 2013-01-09 ENCOUNTER — Other Ambulatory Visit: Payer: Self-pay

## 2013-01-13 ENCOUNTER — Encounter: Payer: Self-pay | Admitting: Family Medicine

## 2013-03-19 ENCOUNTER — Other Ambulatory Visit: Payer: Self-pay | Admitting: Gastroenterology

## 2013-03-19 ENCOUNTER — Encounter (HOSPITAL_COMMUNITY): Payer: Self-pay | Admitting: *Deleted

## 2013-03-19 ENCOUNTER — Encounter (HOSPITAL_COMMUNITY): Payer: Self-pay | Admitting: Pharmacy Technician

## 2013-03-21 ENCOUNTER — Encounter (HOSPITAL_COMMUNITY): Payer: Self-pay | Admitting: *Deleted

## 2013-03-21 ENCOUNTER — Encounter (HOSPITAL_COMMUNITY): Payer: Medicare Other | Admitting: Anesthesiology

## 2013-03-21 ENCOUNTER — Ambulatory Visit (HOSPITAL_COMMUNITY)
Admission: RE | Admit: 2013-03-21 | Discharge: 2013-03-21 | Disposition: A | Discharge status: Home / Self Care | Payer: Medicare Other | Source: Ambulatory Visit | Attending: Gastroenterology | Admitting: Gastroenterology

## 2013-03-21 ENCOUNTER — Encounter (HOSPITAL_COMMUNITY)
Admission: RE | Disposition: A | Discharge status: Home / Self Care | Payer: Self-pay | Source: Ambulatory Visit | Attending: Gastroenterology

## 2013-03-21 ENCOUNTER — Ambulatory Visit (HOSPITAL_COMMUNITY): Payer: Medicare Other | Admitting: Anesthesiology

## 2013-03-21 DIAGNOSIS — K319 Disease of stomach and duodenum, unspecified: Secondary | ICD-10-CM | POA: Insufficient documentation

## 2013-03-21 DIAGNOSIS — Z8711 Personal history of peptic ulcer disease: Secondary | ICD-10-CM | POA: Insufficient documentation

## 2013-03-21 DIAGNOSIS — K219 Gastro-esophageal reflux disease without esophagitis: Secondary | ICD-10-CM | POA: Insufficient documentation

## 2013-03-21 DIAGNOSIS — E785 Hyperlipidemia, unspecified: Secondary | ICD-10-CM | POA: Insufficient documentation

## 2013-03-21 DIAGNOSIS — I10 Essential (primary) hypertension: Secondary | ICD-10-CM | POA: Insufficient documentation

## 2013-03-21 DIAGNOSIS — Z87891 Personal history of nicotine dependence: Secondary | ICD-10-CM | POA: Insufficient documentation

## 2013-03-21 DIAGNOSIS — R002 Palpitations: Secondary | ICD-10-CM | POA: Insufficient documentation

## 2013-03-21 HISTORY — PX: EUS: SHX5427

## 2013-03-21 SURGERY — UPPER ENDOSCOPIC ULTRASOUND (EUS) LINEAR
Anesthesia: Monitor Anesthesia Care

## 2013-03-21 MED ORDER — PROPOFOL 10 MG/ML IV BOLUS
INTRAVENOUS | Status: AC
  Filled 2013-03-21: qty 20

## 2013-03-21 MED ORDER — SODIUM CHLORIDE 0.9 % IV SOLN: INTRAVENOUS | Status: DC

## 2013-03-21 MED ORDER — LACTATED RINGERS IV SOLN
INTRAVENOUS | Status: DC
Start: 2013-03-21 — End: 2013-03-21
  Administered 2013-03-21: 1000 mL via INTRAVENOUS

## 2013-03-21 MED ORDER — PROPOFOL INFUSION 10 MG/ML OPTIME
INTRAVENOUS | Status: DC | PRN
  Administered 2013-03-21: 300 ug/kg/min via INTRAVENOUS

## 2013-03-21 NOTE — Anesthesia Postprocedure Evaluation (Signed)
  Anesthesia Post-op Note  Patient: [name]  Procedure(s) Performed: Procedure(s) (LRB): UPPER ENDOSCOPIC ULTRASOUND (EUS) LINEAR (N/A)  Patient Location: PACU  Anesthesia Type: MAC  Level of Consciousness: awake and alert   Airway and Oxygen Therapy: Patient Spontanous Breathing  Post-op Pain: mild  Post-op Assessment: Post-op Vital signs reviewed, Patient's Cardiovascular Status Stable, Respiratory Function Stable, Patent Airway and No signs of Nausea or vomiting  Last Vitals:  Filed Vitals:   03/21/13 1332  BP: 129/77  Temp:   Resp: 11    Post-op Vital Signs: stable   Complications: No apparent anesthesia complications

## 2013-03-21 NOTE — H&P (Signed)
  [name] HPI: This is a 68 year old male with a PMH of a proximal gastric submucosal lesion.  Two serial EUS examinations with FNAs were negative for any malignancy. The lesion measured 10 x 8 mm and there was no interval change from 2011 to 2012.  He is here for follow up the lesion today.  Past Medical History  Diagnosis Date  . GERD (gastroesophageal reflux disease)   . Ulcer   . Hypertension   . Hyperlipidemia   . Arrhythmia     palpitations    Past Surgical History  Procedure Laterality Date  . Hernia repair  2001  . Laparoscopic gastrotomy w/ repair of ulcer  1997    Family History  Problem Relation Age of Onset  . Arthritis Mother   . Hypertension Mother   . Heart disease Father     CHF  . Hypertension Father   . Diverticulitis Father   . Hypertension Brother   . Diabetes Maternal Grandmother   . Colon cancer Paternal Grandmother   . Hyperlipidemia Brother   . Hyperlipidemia Sister   . Hyperlipidemia Brother   . Hypertension Brother   . Heart disease Brother 73    quadruple bypass, MI  . Diverticulitis Brother     Social History:  reports that he quit smoking about 42 years ago. His smoking use included Cigarettes. He smoked 0.00 packs per day. He has never used smokeless tobacco. He reports that he does not drink alcohol or use illicit drugs.  Allergies:  Allergies  Allergen Reactions  . Codeine Nausea And Vomiting  . Penicillins Rash    Medications:  Scheduled:  Continuous: . sodium chloride    . lactated ringers 1,000 mL (03/21/13 1136)    No results found for this or any previous visit (from the past 24 hour(s)).   No results found.  ROS:  As stated above in the HPI otherwise negative.  Blood pressure 132/93, temperature 98.2 F (36.8 C), temperature source Oral, resp. rate 11, height 6' (1.829 m), weight 185 lb (83.915 kg), SpO2 98.00%.    PE: Gen: NAD, Alert and Oriented HEENT:  [place]/AT, EOMI Neck: Supple, no LAD Lungs: CTA  Bilaterally CV: RRR without M/G/R ABM: Soft, NTND, +BS Ext: No C/C/E  Assessment/Plan: 1) Gastric submucosal lesion.  Plan: 1) EUS with possible FNA.  The FNAs were difficult as the lesion was rather mobile in the past.  Arielis Leonhart D 03/21/2013, 12:25 PM

## 2013-03-21 NOTE — Anesthesia Preprocedure Evaluation (Signed)
Anesthesia Evaluation  Patient identified by MRN, date of birth, ID band Patient awake    Reviewed: Allergy & Precautions, H&P , NPO status , Patient's Chart, lab work & pertinent test results  Airway Mallampati: II TM Distance: >3 FB Neck ROM: Full    Dental no notable dental hx. (+) Edentulous Upper and Upper Dentures   Pulmonary neg pulmonary ROS, former smoker,  breath sounds clear to auscultation  Pulmonary exam normal       Cardiovascular hypertension, Pt. on medications negative cardio ROS  Dysrhythmias: bigeminy. Rhythm:Regular Rate:Normal     Neuro/Psych negative neurological ROS  negative psych ROS   GI/Hepatic negative GI ROS, Neg liver ROS,   Endo/Other  negative endocrine ROS  Renal/GU negative Renal ROS  negative genitourinary   Musculoskeletal negative musculoskeletal ROS (+)   Abdominal   Peds negative pediatric ROS (+)  Hematology negative hematology ROS (+)   Anesthesia Other Findings   Reproductive/Obstetrics negative OB ROS                           Anesthesia Physical Anesthesia Plan  ASA: II  Anesthesia Plan: MAC   Post-op Pain Management:    Induction: Intravenous  Airway Management Planned: Simple Face Mask  Additional Equipment:   Intra-op Plan:   Post-operative Plan: Extubation in OR  Informed Consent: I have reviewed the patients History and Physical, chart, labs and discussed the procedure including the risks, benefits and alternatives for the proposed anesthesia with the patient or authorized representative who has indicated his/her understanding and acceptance.   Dental advisory given  Plan Discussed with: CRNA  Anesthesia Plan Comments:         Anesthesia Quick Evaluation

## 2013-03-21 NOTE — Transfer of Care (Signed)
Immediate Anesthesia Transfer of Care Note  Patient: [name]  Procedure(s) Performed: Procedure(s): UPPER ENDOSCOPIC ULTRASOUND (EUS) LINEAR (N/A)  Patient Location: PACU and Endoscopy Unit  Anesthesia Type:MAC  Level of Consciousness: patient cooperative  Airway & Oxygen Therapy: Patient Spontanous Breathing and Patient connected to nasal cannula oxygen  Post-op Assessment: Report given to PACU RN and Post -op Vital signs reviewed and stable  Post vital signs: Reviewed and stable  Complications: No apparent anesthesia complications

## 2013-03-21 NOTE — Op Note (Signed)
[facility] [place], [address]   ENDOSCOPIC ULTRASOUND PROCEDURE REPORT  PATIENT: Douglas, Obrien  MR#: [id number] BIRTHDATE: August 31, 1945  GENDER: Male ENDOSCOPIST: Carol Ada, MD REFERRED BY: PROCEDURE DATE:  03/21/2013 PROCEDURE:   Upper EUS ASA CLASS:      Class II INDICATIONS:   Follow up of gastric submucosal nodule MEDICATIONS: MAC sedation, administered by CRNA  DESCRIPTION OF PROCEDURE:   After the risks benefits and alternatives of the procedure were  explained, informed consent was obtained. The patient was then placed in the left, lateral, decubitus postion and IV sedation was administered. Throughout the procedure, the patients blood pressure, pulse and oxygen saturations were monitored continuously.  Under direct visualization, the     endoscope was introduced through the mouth and advanced to the first portion of the duodenum .  Water was used as necessary to provide an acoustic interface.  Upon completion of the imaging, water was removed and the patient was sent to the recovery room in satisfactory condition.      FINDINGS: The proximal submucosal nodule was again identified.  The lesion was hypoechoic with evidence of some central caldifications. The lesion measures 9.5 mm x 8.9 mm.  There is essentially no change to the lesion.   The scope was then withdrawn from the patient and the procedure completed.  COMPLICATIONS: There were no complications. ENDOSCOPIC VISUALIZATION:  ULTRASONIC VISUALIZATION:  STAGING:  ENDOSCOPIC IMPRESSION: 1) Stable submucosal lesion.  RECOMMENDATIONS: 1) Possible repeat EUS in 5 years.  _______________________________ eSignedCarol Ada, MD 03/21/2013 1:09 PM   CC:

## 2013-03-21 NOTE — Discharge Instructions (Signed)
Gastrointestinal Endoscopy Care After Refer to this sheet in the next few weeks. These instructions provide you with information on caring for yourself after your procedure. Your caregiver may also give you more specific instructions. Your treatment has been planned according to current medical practices, but problems sometimes occur. Call your caregiver if you have any problems or questions after your procedure. HOME CARE INSTRUCTIONS  If you were given medicine to help you relax (sedative), do not drive, operate machinery, or sign important documents for 24 hours.  Avoid alcohol and hot or warm beverages for the first 24 hours after the procedure.  Only take over-the-counter or prescription medicines for pain, discomfort, or fever as directed by your caregiver. You may resume taking your normal medicines unless your caregiver tells you otherwise. Ask your caregiver when you may resume taking medicines that may cause bleeding, such as aspirin, clopidogrel, or warfarin.  You may return to your normal diet and activities on the day after your procedure, or as directed by your caregiver. Walking may help to reduce any bloated feeling in your abdomen.  Drink enough fluids to keep your urine clear or pale yellow.  You may gargle with salt water if you have a sore throat. SEEK IMMEDIATE MEDICAL CARE IF:  You have severe nausea or vomiting.  You have severe abdominal pain, abdominal cramps that last longer than 6 hours, or abdominal swelling (distention).  You have severe shoulder or back pain.  You have trouble swallowing.  You have shortness of breath, your breathing is shallow, or you are breathing faster than normal.  You have a fever or a rapid heartbeat.  You vomit blood or material that looks like coffee grounds.  You have bloody, black, or tarry stools. MAKE SURE YOU:  Understand these instructions.  Will watch your condition.  Will get help right away if you are not doing  well or get worse. Document Released: 10/05/2003 Document Revised: 08/22/2011 Document Reviewed: 05/23/2011 [facility] Patient Information 2014 [place], [place]. Conscious Sedation, Adult, Care After Refer to this sheet in the next few weeks. These instructions provide you with information on caring for yourself after your procedure. Your health care provider may also give you more specific instructions. Your treatment has been planned according to current medical practices, but problems sometimes occur. Call your health care provider if you have any problems or questions after your procedure. WHAT TO EXPECT AFTER THE PROCEDURE  After your procedure:  You may feel sleepy, clumsy, and have poor balance for several hours.  Vomiting may occur if you eat too soon after the procedure. HOME CARE INSTRUCTIONS  Do not participate in any activities where you could become injured for at least 24 hours. Do not:  Drive.  Swim.  Ride a bicycle.  Operate heavy machinery.  Cook.  Use power tools.  Climb ladders.  Work from a high place.  Do not make important decisions or sign legal documents until you are improved.  If you vomit, drink water, juice, or soup when you can drink without vomiting. Make sure you have little or no nausea before eating solid foods.  Only take over-the-counter or prescription medicines for pain, discomfort, or fever as directed by your health care provider.  Make sure you and your family fully understand everything about the medicines given to you, including what side effects may occur.  You should not drink alcohol, take sleeping pills, or take medicines that cause drowsiness for at least 24 hours.  If you smoke, do  not smoke without supervision.  If you are feeling better, you may resume normal activities 24 hours after you were sedated.  Keep all appointments with your health care provider. SEEK MEDICAL CARE IF:  Your skin is pale or bluish in color.  You  continue to feel nauseous or vomit.  Your pain is getting worse and is not helped by medicine.  You have bleeding or swelling.  You are still sleepy or feeling clumsy after 24 hours. SEEK IMMEDIATE MEDICAL CARE IF:  You develop a rash.  You have difficulty breathing.  You develop any type of allergic problem.  You have a fever. MAKE SURE YOU:  Understand these instructions.  Will watch your condition.  Will get help right away if you are not doing well or get worse. Document Released: 12/11/2012 Document Reviewed: 09/27/2012 [facility] Patient Information 2014 [place], [place].

## 2013-03-21 NOTE — Preoperative (Signed)
Beta Blockers   Reason not to administer Beta Blockers:Not Applicable 

## 2013-03-24 ENCOUNTER — Encounter (HOSPITAL_COMMUNITY): Payer: Self-pay | Admitting: Gastroenterology

## 2013-10-25 ENCOUNTER — Ambulatory Visit (INDEPENDENT_AMBULATORY_CARE_PROVIDER_SITE_OTHER): Payer: Medicare Other

## 2013-10-25 ENCOUNTER — Ambulatory Visit (INDEPENDENT_AMBULATORY_CARE_PROVIDER_SITE_OTHER): Payer: Medicare Other | Admitting: Emergency Medicine

## 2013-10-25 VITALS — BP 124/78 | HR 58 | Temp 98.0°F | Resp 17 | Ht 70.5 in | Wt 191.0 lb

## 2013-10-25 DIAGNOSIS — R0781 Pleurodynia: Secondary | ICD-10-CM

## 2013-10-25 DIAGNOSIS — M25519 Pain in unspecified shoulder: Secondary | ICD-10-CM

## 2013-10-25 DIAGNOSIS — M25529 Pain in unspecified elbow: Secondary | ICD-10-CM

## 2013-10-25 DIAGNOSIS — M25429 Effusion, unspecified elbow: Secondary | ICD-10-CM

## 2013-10-25 DIAGNOSIS — M25511 Pain in right shoulder: Secondary | ICD-10-CM

## 2013-10-25 DIAGNOSIS — M25421 Effusion, right elbow: Secondary | ICD-10-CM

## 2013-10-25 DIAGNOSIS — R079 Chest pain, unspecified: Secondary | ICD-10-CM

## 2013-10-25 DIAGNOSIS — M25521 Pain in right elbow: Secondary | ICD-10-CM

## 2013-10-25 LAB — POCT URINALYSIS DIPSTICK
BILIRUBIN UA: NEGATIVE
Blood, UA: NEGATIVE
GLUCOSE UA: NEGATIVE
Leukocytes, UA: NEGATIVE
NITRITE UA: NEGATIVE
PH UA: 5.5
Protein, UA: NEGATIVE
Spec Grav, UA: >=1.03
Urobilinogen, UA: 1

## 2013-10-25 NOTE — Progress Notes (Signed)
Subjective:  This chart was scribed for Arlyss Queen, MD by Donato Schultz, Medical Scribe. This patient was seen in Room 5 and the patient's care was started at 1:50 PM.   Patient ID: [name], male    DOB: 1946-01-31, 68 y.o.   MRN: [id number]  HPI HPI Comments: [name] is a 68 y.o. male who presents to the [facility] complaining of constant right sided back pain and right shoulder pain that started a couple of hours ago today after slipping on plastic stair treads and his feet went out from under him.  He fell down 10-12 steps and landed on his back.  He did not lose consciousness or hit his head.  He denies abdominal pain as an associated symptom.  He has not urinated since the event.     Past Medical History  Diagnosis Date  . GERD (gastroesophageal reflux disease)   . Ulcer   . Hypertension   . Hyperlipidemia   . Arrhythmia     palpitations  . Depression    Past Surgical History  Procedure Laterality Date  . Hernia repair  2001  . Laparoscopic gastrotomy w/ repair of ulcer  1997  . Eus N/A 03/21/2013    Procedure: UPPER ENDOSCOPIC ULTRASOUND (EUS) LINEAR;  Surgeon: Beryle Beams, MD;  Location: [facility] ENDOS[facility];  Service: Endoscopy;  Laterality: N/A;  . Vasectomy     Family History  Problem Relation Age of Onset  . Arthritis Mother   . Hypertension Mother   . Cancer Mother   . Heart disease Father     CHF  . Hypertension Father   . Diverticulitis Father   . Cancer Father   . Hypertension Brother   . Diabetes Maternal Grandmother   . Colon cancer Paternal Grandmother   . Cancer Paternal Grandmother   . Hyperlipidemia Brother   . Hyperlipidemia Sister   . Hyperlipidemia Brother   . Hypertension Brother   . Heart disease Brother 73    quadruple bypass, MI  . Diverticulitis Brother   . Heart disease Maternal Grandfather   . Heart disease Paternal Grandfather    History   Social History  . Marital Status: Married    Spouse Name: N/A     Number of Children: N/A  . Years of Education: N/A   Occupational History  . retired [employer]   Social History Main Topics  . Smoking status: Former Smoker    Types: Cigarettes    Quit date: 09/22/1970  . Smokeless tobacco: Never Used  . Alcohol Use: No  . Drug Use: No  . Sexual Activity: Not Currently    Partners: Female   Other Topics Concern  . Not on file   Social History Narrative   Exercise--  no   Allergies  Allergen Reactions  . Codeine Nausea And Vomiting  . Penicillins Rash    Review of Systems  Gastrointestinal: Negative for abdominal pain.     Objective:  Physical Exam  Nursing note and vitals reviewed. Constitutional: He is oriented to person, place, and time. He appears well-developed and well-nourished.  HENT:  Head: Normocephalic and atraumatic.  Eyes: EOM are normal.  Neck: Normal range of motion.  Cardiovascular: Normal rate, regular rhythm and normal heart sounds.  Exam reveals no gallop and no friction rub.   No murmur heard. Pulmonary/Chest: Effort normal and breath sounds normal. No respiratory distress. He has no wheezes. He has no rales.  Musculoskeletal: Normal  range of motion.  Pain and swelling of his right shoulder and right arm but he has tenderness over his right posterior ribs.  Lemon sized ecchymotic area lower right humerus.  Limited range of motion to the right shoulder with no bruising.  No bruising on his back.  Neurological: He is alert and oriented to person, place, and time.  Skin: Skin is warm and dry.  Psychiatric: He has a normal mood and affect. His behavior is normal.   [facility] preliminary x-ray report read by Dr. Everlene Farrier: There may be a crack in the right 6th 7th posterior ribs. Large hiatal hernia present. Degenerative changes humeral head and soft tissue swelling posterior lower right humerus.  Results for orders placed in visit on 10/25/13  POCT URINALYSIS DIPSTICK      Result Value Ref Range   Color, UA orange      Clarity, UA clear     Glucose, UA neg     Bilirubin, UA neg     Ketones, UA trace     Spec Grav, UA >=1.030     Blood, UA neg     pH, UA 5.5     Protein, UA neg     Urobilinogen, UA 1.0     Nitrite, UA neg     Leukocytes, UA Negative      BP 124/78  Pulse 58  Temp(Src) 98 F (36.7 C) (Oral)  Resp 17  Ht 5' 10.5" (1.791 m)  Wt 191 lb (86.637 kg)  BMI 27.01 kg/m2  SpO2 98% Assessment & Plan:  Patient will treat pain with over-the-counter medications. He will keep ice to the areas where he has swelling and bruising.  I personally performed the services described in this documentation, which was scribed in my presence. The recorded information has been reviewed and is accurate.

## 2013-10-25 NOTE — Addendum Note (Signed)
Addended by: Arlyss Queen A on: 10/25/2013 03:45 PM   Modules accepted: Level of Service

## 2013-11-26 ENCOUNTER — Telehealth: Payer: Self-pay

## 2013-11-26 NOTE — Telephone Encounter (Signed)
Pt stated they wbc to schedule AWV with PA or PCP.

## 2014-06-19 DIAGNOSIS — I10 Essential (primary) hypertension: Secondary | ICD-10-CM | POA: Diagnosis not present

## 2014-06-19 DIAGNOSIS — K219 Gastro-esophageal reflux disease without esophagitis: Secondary | ICD-10-CM | POA: Diagnosis not present

## 2014-06-19 DIAGNOSIS — I4940 Unspecified premature depolarization: Secondary | ICD-10-CM | POA: Diagnosis not present

## 2014-06-19 DIAGNOSIS — E785 Hyperlipidemia, unspecified: Secondary | ICD-10-CM | POA: Diagnosis not present

## 2014-10-12 LAB — IFOBT (OCCULT BLOOD): IMMUNOLOGICAL FECAL OCCULT BLOOD TEST: NEGATIVE

## 2014-10-16 DIAGNOSIS — I10 Essential (primary) hypertension: Secondary | ICD-10-CM | POA: Diagnosis not present

## 2014-10-16 DIAGNOSIS — I4940 Unspecified premature depolarization: Secondary | ICD-10-CM | POA: Diagnosis not present

## 2014-10-16 DIAGNOSIS — K219 Gastro-esophageal reflux disease without esophagitis: Secondary | ICD-10-CM | POA: Diagnosis not present

## 2014-10-16 DIAGNOSIS — E785 Hyperlipidemia, unspecified: Secondary | ICD-10-CM | POA: Diagnosis not present

## 2014-10-16 DIAGNOSIS — E669 Obesity, unspecified: Secondary | ICD-10-CM | POA: Diagnosis not present

## 2014-11-12 ENCOUNTER — Encounter: Payer: Self-pay | Admitting: Family Medicine

## 2015-02-19 DIAGNOSIS — K219 Gastro-esophageal reflux disease without esophagitis: Secondary | ICD-10-CM | POA: Diagnosis not present

## 2015-02-19 DIAGNOSIS — E785 Hyperlipidemia, unspecified: Secondary | ICD-10-CM | POA: Diagnosis not present

## 2015-02-19 DIAGNOSIS — E669 Obesity, unspecified: Secondary | ICD-10-CM | POA: Diagnosis not present

## 2015-02-19 DIAGNOSIS — I10 Essential (primary) hypertension: Secondary | ICD-10-CM | POA: Diagnosis not present

## 2015-02-19 DIAGNOSIS — I4940 Unspecified premature depolarization: Secondary | ICD-10-CM | POA: Diagnosis not present

## 2015-06-25 DIAGNOSIS — K219 Gastro-esophageal reflux disease without esophagitis: Secondary | ICD-10-CM | POA: Diagnosis not present

## 2015-06-25 DIAGNOSIS — I4940 Unspecified premature depolarization: Secondary | ICD-10-CM | POA: Diagnosis not present

## 2015-06-25 DIAGNOSIS — I10 Essential (primary) hypertension: Secondary | ICD-10-CM | POA: Diagnosis not present

## 2015-06-25 DIAGNOSIS — E785 Hyperlipidemia, unspecified: Secondary | ICD-10-CM | POA: Diagnosis not present

## 2015-10-04 ENCOUNTER — Telehealth: Payer: Self-pay

## 2015-10-07 ENCOUNTER — Ambulatory Visit: Payer: Federal, State, Local not specified - PPO

## 2015-10-15 ENCOUNTER — Ambulatory Visit: Payer: Federal, State, Local not specified - PPO | Admitting: Family Medicine

## 2015-10-21 ENCOUNTER — Telehealth: Payer: Self-pay | Admitting: *Deleted

## 2015-10-21 NOTE — Telephone Encounter (Signed)
Unable to reach patient at time of Pre-Visit Call.  Left message for patient to return call when available.    

## 2015-10-22 ENCOUNTER — Ambulatory Visit (INDEPENDENT_AMBULATORY_CARE_PROVIDER_SITE_OTHER): Payer: Medicare Other | Admitting: Family Medicine

## 2015-10-22 ENCOUNTER — Encounter: Payer: Self-pay | Admitting: Family Medicine

## 2015-10-22 VITALS — BP 140/76 | HR 58 | Temp 98.0°F | Ht 71.0 in | Wt 203.2 lb

## 2015-10-22 DIAGNOSIS — R972 Elevated prostate specific antigen [PSA]: Secondary | ICD-10-CM

## 2015-10-22 DIAGNOSIS — K4090 Unilateral inguinal hernia, without obstruction or gangrene, not specified as recurrent: Secondary | ICD-10-CM | POA: Diagnosis not present

## 2015-10-22 NOTE — Progress Notes (Signed)
Pre visit review using our clinic review tool, if applicable. No additional management support is needed unless otherwise documented below in the visit note. 

## 2015-10-22 NOTE — Progress Notes (Signed)
Chief Complaint  Patient presents with  . Establish Care    pt requesting Wellness visit,also dicuss hernia     Well Male [name] is here for establishing care with a PCP and a L hernia.  L hernia has been present for 3.5 years and is slowing increasing in size and pressure. It is reducible. It does not cause him pain, skin changes, abdominal discomfort, blood in the stool, or testicular pain.   Of note, when reviewing his labs, in our lab system, he had a PSA in 2013 of 2.18. In 11/2014 with the [place], it had increased to 3.74. He has never seen a urologist and denies urinary symptoms.  Past Medical History:  Diagnosis Date  . Arrhythmia    palpitations  . Depression   . GERD (gastroesophageal reflux disease)   . Hyperlipidemia   . Hypertension   . Ulcer     Past Surgical History:  Procedure Laterality Date  . EUS N/A 03/21/2013   Procedure: UPPER ENDOSCOPIC ULTRASOUND (EUS) LINEAR;  Surgeon: Beryle Beams, MD;  Location: [facility];  Service: Endoscopy;  Laterality: N/A;  . HERNIA REPAIR  2001  . LAPAROSCOPIC GASTROTOMY W/ REPAIR OF ULCER  1997  . VASECTOMY     Medications  Current Outpatient Prescriptions on File Prior to Visit  Medication Sig Dispense Refill  . aspirin 81 MG tablet Take 81 mg by mouth daily.     . fish oil-omega-3 fatty acids 1000 MG capsule Take 1 g by mouth daily.     . metoprolol (LOPRESSOR) 50 MG tablet Take 50 mg by mouth 2 (two) times daily.     . Multiple Vitamin (MULTIVITAMIN) tablet Take 1 tablet by mouth daily.     Marland Kitchen NITROSTAT 0.4 MG SL tablet Place 0.4 mg under the tongue every 5 (five) minutes as needed for chest pain.     Marland Kitchen omeprazole (PRILOSEC) 20 MG capsule Take 1 capsule by mouth Daily.     Allergies Allergies  Allergen Reactions  . Codeine Nausea And Vomiting  . Penicillins Rash   Family History Family History  Problem Relation Age of Onset  . Arthritis Mother   . Hypertension Mother   . Cancer Mother   . Heart disease  Father     CHF  . Hypertension Father   . Diverticulitis Father   . Cancer Father   . Hypertension Brother   . Diabetes Maternal Grandmother   . Colon cancer Paternal Grandmother   . Cancer Paternal Grandmother   . Hyperlipidemia Brother   . Hyperlipidemia Sister   . Hyperlipidemia Brother   . Hypertension Brother   . Heart disease Brother 73    quadruple bypass, MI  . Diverticulitis Brother   . Heart disease Maternal Grandfather   . Heart disease Paternal Grandfather     Review of Systems: Constitutional: no fevers or chills Gastrointestinal:  no abdominal pain, no change in bowel habits, no nausea, vomiting, diarrhea, or constipation and no black or bloody stool GU:  Male: negative for dysuria, frequency, and incontinence and negative for prostate symptoms  Exam BP 140/76 (BP Location: Left Arm, Patient Position: Sitting, Cuff Size: Large)   Pulse (!) 58   Temp 98 F (36.7 C) (Oral)   Ht 5\' 11"  (1.803 m)   Wt 203 lb 3.2 oz (92.2 kg)   SpO2 99%   BMI 28.34 kg/m  General:  well developed, well nourished, in no apparent distress Neck: neck supple without adenopathy, thyromegaly, or  masses Lungs:  clear to auscultation, breath sounds equal bilaterally, no respiratory distress Cardio:  regular rate and rhythm without murmurs, heart sounds without clicks or rubs Abdomen:  abdomen soft, nontender; bowel sounds normal; no masses or organomegaly Genital (male): circumcised penis, no lesions or discharge; testes present bilaterally without masses or tenderness, +L sided inguinal hernia Neuro:  gait normal Psych: normal mood, flat affect; age appropriate judgment/insight  Assessment and Plan  Unilateral inguinal hernia without obstruction or gangrene, recurrence not specified  Increased prostate specific antigen (PSA) velocity   Orders as above. Pt would like to wait and see regarding his inguinal hernia. No signs of incarceration, but he was counseled on the signs and  symptoms that would necessitate seeking emergent care. Will get repeat PSA done in October at [facility], will make decision based on that. Follow up in 3 mo for cpx and review of labs. If there is little change from his previous [place] lab, we can keep an eye on it. If it has continued to increase, will refer to urology for biopsy. I offered a referral today, however the patient would like to see what his upcoming labs are first. The patient voiced understanding and agreement to the plan.  [name]

## 2015-10-22 NOTE — Patient Instructions (Signed)
Your PSA in 2013 was 2.18. In 2016 with the [facility] it was 3.74. The velocity (increase) over 3 years is greater than 0.35 per year. Please make sure your PSA is repeated in October for your yearly labs and show this message to your [facility] physician. Follow up with me after your labs and we can discuss this result in addition to your other labs.

## 2015-10-29 DIAGNOSIS — I4940 Unspecified premature depolarization: Secondary | ICD-10-CM | POA: Diagnosis not present

## 2015-10-29 DIAGNOSIS — K219 Gastro-esophageal reflux disease without esophagitis: Secondary | ICD-10-CM | POA: Diagnosis not present

## 2015-10-29 DIAGNOSIS — I10 Essential (primary) hypertension: Secondary | ICD-10-CM | POA: Diagnosis not present

## 2015-10-29 DIAGNOSIS — E785 Hyperlipidemia, unspecified: Secondary | ICD-10-CM | POA: Diagnosis not present

## 2016-02-23 ENCOUNTER — Ambulatory Visit (INDEPENDENT_AMBULATORY_CARE_PROVIDER_SITE_OTHER): Payer: Medicare Other | Admitting: Family Medicine

## 2016-02-23 ENCOUNTER — Encounter: Payer: Self-pay | Admitting: Family Medicine

## 2016-02-23 VITALS — BP 112/60 | HR 61 | Temp 98.3°F | Ht 71.0 in | Wt 204.4 lb

## 2016-02-23 DIAGNOSIS — Z0000 Encounter for general adult medical examination without abnormal findings: Secondary | ICD-10-CM | POA: Diagnosis not present

## 2016-02-23 NOTE — Progress Notes (Signed)
Pre visit review using our clinic review tool, if applicable. No additional management support is needed unless otherwise documented below in the visit note. 

## 2016-02-23 NOTE — Progress Notes (Signed)
Chief Complaint  Patient presents with  . Annual Exam    Well Male [name] is here for a complete physical.   His last physical was >1 year ago.  Current diet: in general, a "healthy" diet   Current exercise: nothing Weight trend: stable Does pt snore? No.  Daytime fatigue? No. Seat belt? Yes.    Past Medical History:  Diagnosis Date  . Arrhythmia    palpitations  . Depression   . GERD (gastroesophageal reflux disease)   . Hyperlipidemia   . Hypertension   . Ulcer [facility])     Past Surgical History:  Procedure Laterality Date  . EUS N/A 03/21/2013   Procedure: UPPER ENDOSCOPIC ULTRASOUND (EUS) LINEAR;  Surgeon: Beryle Beams, MD;  Location: [facility];  Service: Endoscopy;  Laterality: N/A;  . HERNIA REPAIR  2001  . LAPAROSCOPIC GASTROTOMY W/ REPAIR OF ULCER  1997  . VASECTOMY     Medications  Current Outpatient Prescriptions on File Prior to Visit  Medication Sig Dispense Refill  . aspirin 81 MG tablet Take 81 mg by mouth daily.     Marland Kitchen atorvastatin (LIPITOR) 80 MG tablet Take 80 mg by mouth daily. TAKE ONE-HALF TABLET BY MOUTH AT BEDTIME FOR CHOLESTEROL.    . Cholecalciferol 2000 units TABS Take 2 tablets by mouth daily.    . fish oil-omega-3 fatty acids 1000 MG capsule Take 1 g by mouth daily.     . hydrocortisone 2.5 % cream APPLY SMALL AMOUNT TO AFFECTED AREA TWICE A DAY AS NEEDED USE MIXED WITH LAMISIL CREAM TWICE A DAY ON AFFECTED AREA.    Marland Kitchen metoprolol (LOPRESSOR) 50 MG tablet Take 50 mg by mouth 2 (two) times daily.     . Multiple Vitamin (MULTIVITAMIN) tablet Take 1 tablet by mouth daily.     Marland Kitchen NITROSTAT 0.4 MG SL tablet Place 0.4 mg under the tongue every 5 (five) minutes as needed for chest pain.     Marland Kitchen omeprazole (PRILOSEC) 20 MG capsule Take 1 capsule by mouth Daily.    Marland Kitchen oxymetazoline (AFRIN) 0.05 % nasal spray Place 2 sprays into both nostrils 2 (two) times daily.    . PSYLLIUM HUSK PO TAKE 1 TEASPOONFUL BY  MOUTH DAILY.  (MIX IN 8 OUNCES OF WATER OR  JUICE AND DRINK).    . sildenafil (VIAGRA) 100 MG tablet Take 100 mg by mouth daily as needed for erectile dysfunction. TAKE ONE-HALF TABLET BY MOUTH AS INSTRUCTED. (TAKE 1 HOUR PRIOR TO SEXUAL ACTIVITY *DO NOT EXCEED 1 DOSE PER 24 HOUR PERIOD*)    . terbinafine (LAMISIL) 1 % cream APPLY SMALL AMOUNT TO AFFECTED AREA TWICE A DAY TO GROIN.     Allergies Allergies  Allergen Reactions  . Codeine Nausea And Vomiting  . Penicillins Rash   Family History Family History  Problem Relation Age of Onset  . Arthritis Mother   . Hypertension Mother   . Cancer Mother   . Heart disease Father     CHF  . Hypertension Father   . Diverticulitis Father   . Cancer Father   . Hypertension Brother   . Diabetes Maternal Grandmother   . Colon cancer Paternal Grandmother   . Cancer Paternal Grandmother   . Hyperlipidemia Brother   . Hyperlipidemia Sister   . Hyperlipidemia Brother   . Hypertension Brother   . Heart disease Brother 73    quadruple bypass, MI  . Diverticulitis Brother   . Heart disease Maternal Grandfather   .  Heart disease Paternal Grandfather     Review of Systems: Constitutional:  no unexpected change in weight, no fevers or chills Eye:  no recent significant change in vision Ear/Nose/Mouth/Throat:  Ears:  no tinnitus or hearing loss Nose/Mouth/Throat:  no complaints of nasal congestion or bleeding, no sore throat and oral sores Cardiovascular:  no chest pain, no palpitations Respiratory:  no cough and no shortness of breath Gastrointestinal:  no abdominal pain, no change in bowel habits, no nausea, vomiting, diarrhea, or constipation and no black or bloody stool GU:  Male: negative for dysuria, frequency, and incontinence and negative for prostate symptoms Musculoskeletal/Extremities:  +b/l shoulder pain, otherwise no pain, redness, or swelling of the joints Integumentary (Skin/Breast):  no abnormal skin lesions reported Neurologic:  no headaches, no numbness,  tingling Endocrine:  weight changes, masses in the neck, heat/cold intolerance, bowel or skin changes, or cardiovascular system symptoms Hematologic/Lymphatic:  no abnormal bleeding, no HIV risk factors, no night sweats, no swollen nodes, no weight loss  Exam BP 112/60 (BP Location: Left Arm, Patient Position: Sitting, Cuff Size: Small)   Pulse 61   Temp 98.3 F (36.8 C) (Oral)   Ht 5\' 11"  (1.803 m)   Wt 204 lb 6.4 oz (92.7 kg)   SpO2 97%   BMI 28.51 kg/m  General:  well developed, well nourished, in no apparent distress Skin:  no significant moles, warts, or growths Head:  no masses, lesions, or tenderness Eyes:  pupils equal and round, sclera anicteric without injection Ears:  canals without lesions, TMs shiny without retraction, no obvious effusion, no erythema Nose:  nares patent, septum midline, mucosa normal Throat/Pharynx:  lips and gingiva without lesion; tongue and uvula midline; non-inflamed pharynx; no exudates or postnasal drainage Neck: neck supple without adenopathy, thyromegaly, or masses Lungs:  clear to auscultation, breath sounds equal bilaterally, no respiratory distress Cardio:  regular rate and rhythm without murmurs, heart sounds without clicks or rubs Abdomen:  abdomen soft, nontender; bowel sounds normal; no masses or organomegaly Genital (male): circumcised penis, no lesions or discharge; testes present bilaterally without masses or tenderness Rectal: Deferred Musculoskeletal:  symmetrical muscle groups noted without atrophy or deformity Extremities:  no clubbing, cyanosis, or edema, no deformities, no skin discoloration Neuro:  gait normal; deep tendon reflexes normal and symmetric Psych: well oriented, flat affect and appropriate judgment/insight  Assessment and Plan  Well adult exam   Well 70 y.o. male. Counseled on diet and exercise.  Given his PSA velocity, I would like to see his most recent PSA. He is to call the office with the value. Since I'm  not managing any of his chronic medication, I will see him in one year. He should be scheduled for an annual wellness visit w the nursing team. The patient voiced understanding and agreement to the plan.  [place], DO 02/23/16 1:42 PM

## 2016-02-23 NOTE — Patient Instructions (Signed)
Get me the value of your PSA done in October.

## 2016-02-24 ENCOUNTER — Telehealth: Payer: Self-pay | Admitting: Family Medicine

## 2016-02-24 NOTE — Telephone Encounter (Signed)
Please schedule the pt at his earliest convenience with me to discuss his PSA results and our next steps regarding this. Nothing to worry about or anything that requires urgency. TY.  For my reference: PSA was 2.18 in 2014, 3.74 in 2016 and 4.0 in 2017. Based on this velocity (>0.35/year), would consider referral to urology for discussion of prostate biopsy. Will discuss this with him at upcoming appt.

## 2016-02-24 NOTE — Telephone Encounter (Signed)
I have scheduled patient 02/25/16 at 10 am. TL/CMA

## 2016-02-25 ENCOUNTER — Ambulatory Visit (INDEPENDENT_AMBULATORY_CARE_PROVIDER_SITE_OTHER): Payer: Medicare Other | Admitting: Family Medicine

## 2016-02-25 ENCOUNTER — Encounter: Payer: Self-pay | Admitting: Family Medicine

## 2016-02-25 VITALS — BP 136/74 | HR 68 | Temp 98.5°F | Ht 71.0 in | Wt 205.8 lb

## 2016-02-25 DIAGNOSIS — R972 Elevated prostate specific antigen [PSA]: Secondary | ICD-10-CM | POA: Diagnosis not present

## 2016-02-25 NOTE — Progress Notes (Signed)
Pre visit review using our clinic review tool, if applicable. No additional management support is needed unless otherwise documented below in the visit note. 

## 2016-02-25 NOTE — Progress Notes (Signed)
Chief Complaint  Patient presents with  . Follow-up    lab results    Subjective: Patient is a 70 y.o. male here for lab review.  In 2013, the patient and a PSA of 2.18. In 2016, the PSA was 3.74. This past October, his PSA was 4.0. Given his PSA velocity, he was brought in to discuss further options. He denies any back pain, urinary symptoms, or weight loss. He's never seen a urologist before.  ROS: GU: As noted HPI  Family History  Problem Relation Age of Onset  . Arthritis Mother   . Hypertension Mother   . Cancer Mother   . Heart disease Father     CHF  . Hypertension Father   . Diverticulitis Father   . Cancer Father   . Hypertension Brother   . Diabetes Maternal Grandmother   . Colon cancer Paternal Grandmother   . Cancer Paternal Grandmother   . Hyperlipidemia Brother   . Hyperlipidemia Sister   . Hyperlipidemia Brother   . Hypertension Brother   . Heart disease Brother 73    quadruple bypass, MI  . Diverticulitis Brother   . Heart disease Maternal Grandfather   . Heart disease Paternal Grandfather    Past Medical History:  Diagnosis Date  . Arrhythmia    palpitations  . Depression   . GERD (gastroesophageal reflux disease)   . Hyperlipidemia   . Hypertension   . Ulcer ([place])    Allergies  Allergen Reactions  . Codeine Nausea And Vomiting  . Penicillins Rash    Current Outpatient Prescriptions:  .  aspirin 81 MG tablet, Take 81 mg by mouth daily. , Disp: , Rfl:  .  atorvastatin (LIPITOR) 80 MG tablet, Take 80 mg by mouth daily. TAKE ONE-HALF TABLET BY MOUTH AT BEDTIME FOR CHOLESTEROL., Disp: , Rfl:  .  Cholecalciferol 2000 units TABS, Take 2 tablets by mouth daily., Disp: , Rfl:  .  fish oil-omega-3 fatty acids 1000 MG capsule, Take 1 g by mouth daily. , Disp: , Rfl:  .  hydrocortisone 2.5 % cream, APPLY SMALL AMOUNT TO AFFECTED AREA TWICE A DAY AS NEEDED USE MIXED WITH LAMISIL CREAM TWICE A DAY ON AFFECTED AREA., Disp: , Rfl:  .  metoprolol  (LOPRESSOR) 50 MG tablet, Take 50 mg by mouth 2 (two) times daily. , Disp: , Rfl:  .  Multiple Vitamin (MULTIVITAMIN) tablet, Take 1 tablet by mouth daily. , Disp: , Rfl:  .  NITROSTAT 0.4 MG SL tablet, Place 0.4 mg under the tongue every 5 (five) minutes as needed for chest pain. , Disp: , Rfl:  .  omeprazole (PRILOSEC) 20 MG capsule, Take 1 capsule by mouth Daily., Disp: , Rfl:  .  oxymetazoline (AFRIN) 0.05 % nasal spray, Place 2 sprays into both nostrils 2 (two) times daily., Disp: , Rfl:  .  PSYLLIUM HUSK PO, TAKE 1 TEASPOONFUL BY  MOUTH DAILY.  (MIX IN 8 OUNCES OF WATER OR JUICE AND DRINK)., Disp: , Rfl:  .  sildenafil (VIAGRA) 100 MG tablet, Take 100 mg by mouth daily as needed for erectile dysfunction. TAKE ONE-HALF TABLET BY MOUTH AS INSTRUCTED. (TAKE 1 HOUR PRIOR TO SEXUAL ACTIVITY *DO NOT EXCEED 1 DOSE PER 24 HOUR PERIOD*), Disp: , Rfl:  .  terbinafine (LAMISIL) 1 % cream, APPLY SMALL AMOUNT TO AFFECTED AREA TWICE A DAY TO GROIN., Disp: , Rfl:   Objective: BP 136/74 (BP Location: Left Arm, Patient Position: Sitting, Cuff Size: Small)   Pulse 68  Temp 98.5 F (36.9 C) (Oral)   Ht 5\' 11"  (1.803 m)   Wt 205 lb 12.8 oz (93.4 kg)   SpO2 97%   BMI 28.70 kg/m  General: Awake, appears stated age Rectal: Normal rectal tone, no masses felt in the rectal vault, prostate is mildly enlarged, smooth, no nodules or induration appreciated Psych: Age appropriate judgment and insight, normal affect and mood  Assessment and Plan: Increased prostate specific antigen (PSA) velocity  Offered referral to urology. He would like to see what his next PSA as this, year and make a decision based off of that. I instructed him to let us know if he changes his mind and would like to see a urologist. Also encouraged him to make sure our office gets the PSA level from the [place]. F/u as originally scheduled.  The patient voiced understanding and agreement to the plan.  [place], DO 02/25/16   10:22 AM

## 2016-02-25 NOTE — Patient Instructions (Signed)
Make sure you get our office the next PSA level the [place] takes.   If you change your mind and would like to see a urologist for an opinion, call our office.

## 2016-03-28 ENCOUNTER — Telehealth: Payer: Self-pay | Admitting: Family Medicine

## 2016-03-28 NOTE — Telephone Encounter (Signed)
lvm advising patient to schedule medicare wellness appointment.  °

## 2016-04-10 ENCOUNTER — Telehealth: Payer: Self-pay | Admitting: Family Medicine

## 2016-04-10 DIAGNOSIS — R0789 Other chest pain: Secondary | ICD-10-CM | POA: Diagnosis not present

## 2016-04-10 DIAGNOSIS — I4940 Unspecified premature depolarization: Secondary | ICD-10-CM | POA: Diagnosis not present

## 2016-04-10 DIAGNOSIS — Z01810 Encounter for preprocedural cardiovascular examination: Secondary | ICD-10-CM | POA: Diagnosis not present

## 2016-04-10 DIAGNOSIS — I10 Essential (primary) hypertension: Secondary | ICD-10-CM | POA: Diagnosis not present

## 2016-04-10 DIAGNOSIS — E785 Hyperlipidemia, unspecified: Secondary | ICD-10-CM | POA: Diagnosis not present

## 2016-04-10 NOTE — Telephone Encounter (Signed)
Spoke with patient regarding awv. Patient stated that he would like to schedule his annual wellness later this year. Patient stated that he surgery in soon and would like to take care of that first before scheduling an appt.

## 2016-06-08 ENCOUNTER — Telehealth: Payer: Self-pay | Admitting: Family Medicine

## 2016-06-08 NOTE — Telephone Encounter (Signed)
Called patient to schedule awv. Lvm for patient to call office to schedule appt.  °

## 2016-08-07 DIAGNOSIS — I10 Essential (primary) hypertension: Secondary | ICD-10-CM | POA: Diagnosis not present

## 2016-08-07 DIAGNOSIS — N400 Enlarged prostate without lower urinary tract symptoms: Secondary | ICD-10-CM | POA: Diagnosis not present

## 2016-08-07 DIAGNOSIS — K219 Gastro-esophageal reflux disease without esophagitis: Secondary | ICD-10-CM | POA: Diagnosis not present

## 2016-08-07 DIAGNOSIS — R05 Cough: Secondary | ICD-10-CM | POA: Diagnosis not present

## 2016-08-07 DIAGNOSIS — I4940 Unspecified premature depolarization: Secondary | ICD-10-CM | POA: Diagnosis not present

## 2016-08-11 ENCOUNTER — Encounter: Payer: Self-pay | Admitting: Family Medicine

## 2016-08-11 ENCOUNTER — Ambulatory Visit (INDEPENDENT_AMBULATORY_CARE_PROVIDER_SITE_OTHER): Payer: Medicare Other | Admitting: Family Medicine

## 2016-08-11 VITALS — BP 110/70 | HR 81 | Temp 98.4°F | Ht 71.0 in | Wt 199.0 lb

## 2016-08-11 DIAGNOSIS — K219 Gastro-esophageal reflux disease without esophagitis: Secondary | ICD-10-CM | POA: Diagnosis not present

## 2016-08-11 MED ORDER — PANTOPRAZOLE SODIUM 40 MG PO TBEC: 40.0000 mg | DELAYED_RELEASE_TABLET | Freq: Every day | ORAL | 3 refills | Status: DC

## 2016-08-11 NOTE — Patient Instructions (Addendum)
Up dog yoga pose may help with your abdominal pain.  Finish dose of omeprazole this afternoon.  Start new medicine tomorrow.  Cancel appointment if you are doing better.

## 2016-08-11 NOTE — Progress Notes (Signed)
Chief Complaint  Patient presents with  . Cough    dry-9 days    Gardiner Rhyme here for a cough.  Duration: 9 days  Associated symptoms: strange taste in mouth, reflux symptoms not controlled Denies: sinus congestion, rhinorrhea, sore throat, fevers, SOB, and difficulty swallowing Treatment to date: Omeprazole 20 mg, just increased BID from QD  Sick contacts: No  ROS:  Const: Denies fevers HEENT: As noted in HPI Lungs: No SOB  Past Medical History:  Diagnosis Date  . Arrhythmia    palpitations  . Depression   . GERD (gastroesophageal reflux disease)   . Hyperlipidemia   . Hypertension   . Ulcer    Family History  Problem Relation Age of Onset  . Arthritis Mother   . Hypertension Mother   . Cancer Mother   . Heart disease Father        CHF  . Hypertension Father   . Diverticulitis Father   . Cancer Father   . Hypertension Brother   . Diabetes Maternal Grandmother   . Colon cancer Paternal Grandmother   . Cancer Paternal Grandmother   . Hyperlipidemia Brother   . Hyperlipidemia Sister   . Hyperlipidemia Brother   . Hypertension Brother   . Heart disease Brother 73       quadruple bypass, MI  . Diverticulitis Brother   . Heart disease Maternal Grandfather   . Heart disease Paternal Grandfather     BP 110/70 (BP Location: Left Arm, Patient Position: Sitting, Cuff Size: Normal)   Pulse 81   Temp 98.4 F (36.9 C) (Oral)   Ht 5\' 11"  (1.803 m)   Wt 199 lb (90.3 kg)   SpO2 97%   BMI 27.75 kg/m  General: Awake, alert, appears stated age HEENT: AT, [place], ears patent b/l and TM's neg, nares patent w/o discharge, pharynx pink and without exudates, MMM Neck: No masses or asymmetry Heart: RRR, no murmurs, no bruits Lungs: CTAB, no accessory muscle use Abd: Soft, NT, ND, no masses or organomegaly Psych: Age appropriate judgment and insight, normal mood and affect  Gastroesophageal reflux disease, esophagitis presence not specified - Plan: pantoprazole (PROTONIX)  40 MG tablet  Orders as above. Change Omeprazole to Protonix, sounds like cough related to reflux.  F/u in 2 weeks, cancel appt if doing better. Pt voiced understanding and agreement to the plan.  [place], DO 08/11/16 9:49 AM

## 2016-08-17 DIAGNOSIS — R490 Dysphonia: Secondary | ICD-10-CM | POA: Diagnosis not present

## 2016-08-17 DIAGNOSIS — R05 Cough: Secondary | ICD-10-CM | POA: Diagnosis not present

## 2016-08-30 DIAGNOSIS — J370 Chronic laryngitis: Secondary | ICD-10-CM | POA: Diagnosis not present

## 2016-08-30 DIAGNOSIS — R490 Dysphonia: Secondary | ICD-10-CM | POA: Diagnosis not present

## 2016-08-30 DIAGNOSIS — R05 Cough: Secondary | ICD-10-CM | POA: Diagnosis not present

## 2016-09-20 DIAGNOSIS — K210 Gastro-esophageal reflux disease with esophagitis: Secondary | ICD-10-CM | POA: Diagnosis not present

## 2016-09-20 DIAGNOSIS — J040 Acute laryngitis: Secondary | ICD-10-CM | POA: Diagnosis not present

## 2016-10-16 NOTE — Telephone Encounter (Signed)
error 

## 2016-11-22 ENCOUNTER — Telehealth: Payer: Self-pay | Admitting: Family Medicine

## 2016-11-22 NOTE — Telephone Encounter (Signed)
Pt has been called 2x to schedule AWV. Sent pt MyChart msg and [employer]. Last wellness visit 02/21/2012.

## 2016-12-20 DIAGNOSIS — I10 Essential (primary) hypertension: Secondary | ICD-10-CM | POA: Diagnosis not present

## 2016-12-20 DIAGNOSIS — R319 Hematuria, unspecified: Secondary | ICD-10-CM | POA: Diagnosis not present

## 2016-12-20 DIAGNOSIS — N400 Enlarged prostate without lower urinary tract symptoms: Secondary | ICD-10-CM | POA: Diagnosis not present

## 2016-12-20 DIAGNOSIS — I4940 Unspecified premature depolarization: Secondary | ICD-10-CM | POA: Diagnosis not present

## 2016-12-20 DIAGNOSIS — E785 Hyperlipidemia, unspecified: Secondary | ICD-10-CM | POA: Diagnosis not present

## 2017-03-06 DIAGNOSIS — C61 Malignant neoplasm of prostate: Secondary | ICD-10-CM | POA: Insufficient documentation

## 2017-04-10 ENCOUNTER — Encounter: Payer: Self-pay | Admitting: Medical

## 2017-04-10 ENCOUNTER — Ambulatory Visit (HOSPITAL_BASED_OUTPATIENT_CLINIC_OR_DEPARTMENT_OTHER)
Admission: RE | Admit: 2017-04-10 | Discharge: 2017-04-10 | Disposition: A | Discharge status: Home / Self Care | Payer: Medicare Other | Source: Ambulatory Visit | Attending: Medical | Admitting: Medical

## 2017-04-10 ENCOUNTER — Other Ambulatory Visit: Payer: Self-pay | Admitting: Medical

## 2017-04-10 ENCOUNTER — Ambulatory Visit (INDEPENDENT_AMBULATORY_CARE_PROVIDER_SITE_OTHER): Payer: Medicare Other | Admitting: Medical

## 2017-04-10 VITALS — BP 134/71 | HR 52 | Temp 98.1°F | Resp 16 | Ht 71.0 in | Wt 183.0 lb

## 2017-04-10 DIAGNOSIS — R29818 Other symptoms and signs involving the nervous system: Secondary | ICD-10-CM

## 2017-04-10 DIAGNOSIS — M4692 Unspecified inflammatory spondylopathy, cervical region: Secondary | ICD-10-CM | POA: Diagnosis not present

## 2017-04-10 DIAGNOSIS — R51 Headache: Secondary | ICD-10-CM | POA: Diagnosis not present

## 2017-04-10 DIAGNOSIS — R42 Dizziness and giddiness: Secondary | ICD-10-CM | POA: Diagnosis not present

## 2017-04-10 DIAGNOSIS — M79602 Pain in left arm: Secondary | ICD-10-CM | POA: Insufficient documentation

## 2017-04-10 DIAGNOSIS — R2689 Other abnormalities of gait and mobility: Secondary | ICD-10-CM | POA: Diagnosis not present

## 2017-04-10 DIAGNOSIS — M79645 Pain in left finger(s): Secondary | ICD-10-CM | POA: Insufficient documentation

## 2017-04-10 DIAGNOSIS — M25562 Pain in left knee: Secondary | ICD-10-CM | POA: Diagnosis not present

## 2017-04-10 DIAGNOSIS — M542 Cervicalgia: Secondary | ICD-10-CM | POA: Insufficient documentation

## 2017-04-10 DIAGNOSIS — R110 Nausea: Secondary | ICD-10-CM

## 2017-04-10 DIAGNOSIS — M79622 Pain in left upper arm: Secondary | ICD-10-CM | POA: Diagnosis not present

## 2017-04-10 DIAGNOSIS — G319 Degenerative disease of nervous system, unspecified: Secondary | ICD-10-CM | POA: Diagnosis not present

## 2017-04-10 DIAGNOSIS — S4992XA Unspecified injury of left shoulder and upper arm, initial encounter: Secondary | ICD-10-CM | POA: Diagnosis not present

## 2017-04-10 DIAGNOSIS — S060X0A Concussion without loss of consciousness, initial encounter: Secondary | ICD-10-CM

## 2017-04-10 DIAGNOSIS — M79642 Pain in left hand: Secondary | ICD-10-CM | POA: Diagnosis not present

## 2017-04-10 DIAGNOSIS — H9192 Unspecified hearing loss, left ear: Secondary | ICD-10-CM | POA: Diagnosis not present

## 2017-04-10 DIAGNOSIS — S8992XA Unspecified injury of left lower leg, initial encounter: Secondary | ICD-10-CM | POA: Diagnosis not present

## 2017-04-10 DIAGNOSIS — S6992XA Unspecified injury of left wrist, hand and finger(s), initial encounter: Secondary | ICD-10-CM | POA: Diagnosis not present

## 2017-04-10 DIAGNOSIS — X58XXXA Exposure to other specified factors, initial encounter: Secondary | ICD-10-CM | POA: Diagnosis not present

## 2017-04-10 DIAGNOSIS — M4322 Fusion of spine, cervical region: Secondary | ICD-10-CM | POA: Diagnosis not present
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
## 2017-04-17 ENCOUNTER — Encounter: Payer: Self-pay | Admitting: Medical

## 2017-04-17 ENCOUNTER — Ambulatory Visit (HOSPITAL_BASED_OUTPATIENT_CLINIC_OR_DEPARTMENT_OTHER)
Admission: RE | Admit: 2017-04-17 | Discharge: 2017-04-17 | Disposition: A | Discharge status: Home / Self Care | Payer: Medicare Other | Source: Ambulatory Visit | Attending: Medical | Admitting: Medical

## 2017-04-17 ENCOUNTER — Ambulatory Visit (INDEPENDENT_AMBULATORY_CARE_PROVIDER_SITE_OTHER): Payer: Medicare Other | Admitting: Medical

## 2017-04-17 VITALS — BP 124/71 | HR 56 | Temp 98.1°F | Resp 16 | Ht 73.0 in | Wt 183.4 lb

## 2017-04-17 DIAGNOSIS — M545 Low back pain, unspecified: Secondary | ICD-10-CM

## 2017-04-17 DIAGNOSIS — S060X0D Concussion without loss of consciousness, subsequent encounter: Secondary | ICD-10-CM | POA: Diagnosis not present

## 2017-04-17 DIAGNOSIS — M542 Cervicalgia: Secondary | ICD-10-CM

## 2017-04-17 DIAGNOSIS — M25562 Pain in left knee: Secondary | ICD-10-CM | POA: Diagnosis not present

## 2017-04-17 DIAGNOSIS — M48061 Spinal stenosis, lumbar region without neurogenic claudication: Secondary | ICD-10-CM | POA: Diagnosis not present

## 2017-04-17 DIAGNOSIS — M2578 Osteophyte, vertebrae: Secondary | ICD-10-CM | POA: Insufficient documentation

## 2017-04-17 DIAGNOSIS — H9042 Sensorineural hearing loss, unilateral, left ear, with unrestricted hearing on the contralateral side: Secondary | ICD-10-CM | POA: Diagnosis not present

## 2017-04-17 DIAGNOSIS — M79602 Pain in left arm: Secondary | ICD-10-CM | POA: Diagnosis not present

## 2017-04-17 DIAGNOSIS — S0991XA Unspecified injury of ear, initial encounter: Secondary | ICD-10-CM | POA: Diagnosis not present

## 2017-04-17 DIAGNOSIS — M79645 Pain in left finger(s): Secondary | ICD-10-CM

## 2017-04-17 MED ORDER — CYCLOBENZAPRINE HCL 5 MG PO TABS: 5.0000 mg | ORAL_TABLET | Freq: Every day | ORAL | 0 refills | Status: AC
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
## 2017-04-19 DIAGNOSIS — Z135 Encounter for screening for eye and ear disorders: Secondary | ICD-10-CM | POA: Diagnosis not present

## 2017-04-25 DIAGNOSIS — I10 Essential (primary) hypertension: Secondary | ICD-10-CM | POA: Diagnosis not present

## 2017-04-25 DIAGNOSIS — E785 Hyperlipidemia, unspecified: Secondary | ICD-10-CM | POA: Diagnosis not present

## 2017-04-25 DIAGNOSIS — N400 Enlarged prostate without lower urinary tract symptoms: Secondary | ICD-10-CM | POA: Diagnosis not present

## 2017-05-15 DIAGNOSIS — H3530 Unspecified macular degeneration: Secondary | ICD-10-CM | POA: Insufficient documentation

## 2017-05-15 DIAGNOSIS — H2511 Age-related nuclear cataract, right eye: Secondary | ICD-10-CM | POA: Insufficient documentation

## 2017-05-15 DIAGNOSIS — Z961 Presence of intraocular lens: Secondary | ICD-10-CM | POA: Insufficient documentation

## 2017-06-04 DIAGNOSIS — M6752 Plica syndrome, left knee: Secondary | ICD-10-CM | POA: Diagnosis not present

## 2017-06-04 DIAGNOSIS — M238X2 Other internal derangements of left knee: Secondary | ICD-10-CM | POA: Diagnosis not present

## 2017-06-04 DIAGNOSIS — R600 Localized edema: Secondary | ICD-10-CM | POA: Diagnosis not present

## 2017-06-04 DIAGNOSIS — M24242 Disorder of ligament, left hand: Secondary | ICD-10-CM | POA: Diagnosis not present

## 2017-06-04 DIAGNOSIS — M25462 Effusion, left knee: Secondary | ICD-10-CM | POA: Diagnosis not present

## 2017-06-04 DIAGNOSIS — M660 Rupture of popliteal cyst: Secondary | ICD-10-CM | POA: Diagnosis not present

## 2017-08-22 DIAGNOSIS — I10 Essential (primary) hypertension: Secondary | ICD-10-CM | POA: Diagnosis not present

## 2017-08-22 DIAGNOSIS — N400 Enlarged prostate without lower urinary tract symptoms: Secondary | ICD-10-CM | POA: Diagnosis not present

## 2017-08-22 DIAGNOSIS — E785 Hyperlipidemia, unspecified: Secondary | ICD-10-CM | POA: Diagnosis not present

## 2017-11-26 ENCOUNTER — Ambulatory Visit: Payer: Medicare Other | Attending: Physical Medicine and Rehabilitation | Admitting: Audiology

## 2017-11-26 DIAGNOSIS — H9193 Unspecified hearing loss, bilateral: Secondary | ICD-10-CM | POA: Diagnosis not present

## 2017-11-26 DIAGNOSIS — H93211 Auditory recruitment, right ear: Secondary | ICD-10-CM | POA: Diagnosis not present

## 2017-11-26 DIAGNOSIS — H93299 Other abnormal auditory perceptions, unspecified ear: Secondary | ICD-10-CM | POA: Insufficient documentation

## 2017-11-26 DIAGNOSIS — Z87828 Personal history of other (healed) physical injury and trauma: Secondary | ICD-10-CM | POA: Diagnosis not present
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
## 2017-11-26 NOTE — Patient Instructions (Signed)
  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 Please note that at 2000Hz  and 3000Hz  the right ear has must poorer hearing compared to the left ear.  Word recognition in quiet is excellent on the left side, but drops to fair on the right side.  The right side also has some recruitment or sound sensitivity, which is associated with sensorineural hearing loss.  A hearing aid evaluation is recommended. Since [name] is seen at the [facility] hospital, referral to [facility] audiology for a hearing aid evaluation is recommended.   Amplification helps make the signal louder and therefore often improves hearing and word recognition.  Amplification has many forms including hearing aids in one or both ears, an assistive listening device which have a microphone and speaker such as a small handheld device and/or even a surround sound system of speakers.  Amplification may be covered by some insurances, but not all.  It is important to note that hearing aids must be individually fit according to the hearing test results and the ear shape.  Audiologists and hearing aid dealers in [place] must be licensed in order to dispense hearing aids.  In addition, a trial period is mandated by law in our state because often amplification must be tried and then evaluated in order to determine benefit.     RECOMMENDATIONS: 1.   Monitor hearing closely with a repeat audiological evaluation in 6 months (earlier if there is any change in hearing).  This appointment may be completed here or at the [facility]. 2. To minimize the adverse effects of tinnitus 1) avoid quiet  2) use noise maskers at home such as a sound machine, quiet music, a fan or other background noise at a volume just loud enough to mask the high pitched  tinnitus. 3) If the tinnitus becomes more bothersome, adversely affecting your sleep or concentration, contact your physician,  seek additional medical help by an ENT for further treatment of your tinnitus. 3.  Strategies that help improve hearing include: A) Face the speaker directly. Optimal is having the speakers face well - lit.  Unless amplified, being within 3-6 feet of the speaker will enhance word recognition. B) Avoid having the speaker back-lit as this will minimize the ability to use cues from lip-reading, facial expression and gestures. C)  Word recognition is poorer in background noise. For optimal word recognition, turn off the TV, radio or noisy fan when engaging in conversation. In a restaurant, try to sit away from noise sources and close to the primary speaker.  D)  Ask for topic clarification from time to time in order to remain in the conversation.  Most people don't mind repeating or clarifying a point when asked.  If needed, explain the difficulty hearing in background noise or hearing loss. 4.   Hearing protection was discussed - use hearing protection during noisy activities such as using a weed eater, moving the lawn, shooting, etc. Other hearing protection, such as sponge plugs (available at pharmacies) or earmuffs (available at sporting goods stores or department stores such as [employer]) are useful for noisy activities and venues.  Deborah L. Heide Spark, Au.D., CCC-A Doctor of Audiology  11/26/2017

## 2017-12-17 DIAGNOSIS — H903 Sensorineural hearing loss, bilateral: Secondary | ICD-10-CM | POA: Diagnosis not present

## 2017-12-19 DIAGNOSIS — R001 Bradycardia, unspecified: Secondary | ICD-10-CM | POA: Diagnosis not present

## 2017-12-19 DIAGNOSIS — I10 Essential (primary) hypertension: Secondary | ICD-10-CM | POA: Diagnosis not present

## 2017-12-19 DIAGNOSIS — E785 Hyperlipidemia, unspecified: Secondary | ICD-10-CM | POA: Diagnosis not present

## 2018-01-30 ENCOUNTER — Ambulatory Visit
Admission: RE | Admit: 2018-01-30 | Discharge: 2018-01-30 | Disposition: A | Discharge status: Home / Self Care | Payer: Self-pay | Source: Ambulatory Visit | Attending: Radiation Oncology | Admitting: Radiation Oncology

## 2018-01-30 ENCOUNTER — Other Ambulatory Visit: Payer: Self-pay | Admitting: Radiation Oncology

## 2018-01-30 DIAGNOSIS — C61 Malignant neoplasm of prostate: Secondary | ICD-10-CM

## 2018-02-18 ENCOUNTER — Other Ambulatory Visit: Payer: Self-pay

## 2018-02-18 ENCOUNTER — Ambulatory Visit
Admission: RE | Admit: 2018-02-18 | Discharge: 2018-02-18 | Disposition: A | Discharge status: Home / Self Care | Payer: Medicare Other | Source: Ambulatory Visit | Attending: Radiation Oncology | Admitting: Radiation Oncology

## 2018-02-18 ENCOUNTER — Encounter: Payer: Self-pay | Admitting: Radiation Oncology

## 2018-02-18 ENCOUNTER — Encounter: Payer: Self-pay | Admitting: Medical Oncology

## 2018-02-18 VITALS — BP 128/73 | HR 56 | Temp 98.3°F | Resp 18 | Ht 71.0 in | Wt 169.1 lb

## 2018-02-18 DIAGNOSIS — C61 Malignant neoplasm of prostate: Secondary | ICD-10-CM | POA: Insufficient documentation

## 2018-02-18 DIAGNOSIS — Z79899 Other long term (current) drug therapy: Secondary | ICD-10-CM | POA: Diagnosis not present

## 2018-02-18 DIAGNOSIS — Z87891 Personal history of nicotine dependence: Secondary | ICD-10-CM | POA: Diagnosis not present

## 2018-02-18 DIAGNOSIS — I10 Essential (primary) hypertension: Secondary | ICD-10-CM | POA: Diagnosis not present

## 2018-02-18 DIAGNOSIS — Z808 Family history of malignant neoplasm of other organs or systems: Secondary | ICD-10-CM | POA: Diagnosis not present

## 2018-02-18 HISTORY — DX: Malignant neoplasm of prostate: C61

## 2018-02-18 NOTE — Progress Notes (Signed)
Introduced myself to patient and his wife as the prostate nurse navigator and my role. Patient recently diagnosed and here today to discuss his radiation treatment options. He and his wife watched the video and will discuss treatment options with Dr. Tammi Klippel. He does not know enough to ask questions. I gave them my business card and asked them to call me with questions or concerns. I will give them a call in a few days to follow up.

## 2018-02-18 NOTE — Progress Notes (Signed)
GU Location of Tumor / Histology: prostatic adenocarcinoma  If Prostate Cancer, Gleason Score is (3 + 5) and PSA is (9.96). Prostate volume: 21 cc.   2018  PSA 12 Sept 2014 PSA 2.94  Biopsies of prostate (if applicable) revealed:  Patient confirms he has only had one prostate biopsy.     Past/Anticipated interventions by urology, if any: prostate biopsy, MRI, referral for consideration of radiation therapy. Denies receiving Lupron or having a bone scan.  Past/Anticipated interventions by medical oncology, if any: no  Weight changes, if any: no  Bowel/Bladder complaints, if any: IPSS 18. SHIM 17. Denies dysuria, hematuria, urinary leakage or incontinence.   Nausea/Vomiting, if any: no  Pain issues, if any:  no  SAFETY ISSUES:  Prior radiation? no  Pacemaker/ICD? no  Possible current pregnancy? no  Is the patient on methotrexate? no  Current Complaints / other details:  72 year old male. Married with two sons. Exposed to agent orange in [place].

## 2018-02-18 NOTE — Progress Notes (Signed)
See progress note under physician encounter. 

## 2018-02-18 NOTE — Progress Notes (Signed)
2 Radiation Oncology         [phone] ________________________________  Initial Outpatient Consultation  Name: [name] MRN: [id number]  Date: 02/18/2018  DOB: [birth year]  GE:[id number], Crosby Oyster, DO  Steva Ready, MD   REFERRING PHYSICIAN: Steva Ready, MD  DIAGNOSIS: 72 y.o. 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    ICD-10-CM   1. Malignant neoplasm of prostate ([place]) C61     HISTORY OF PRESENT ILLNESS: [name] is a 72 y.o. male with a diagnosis of prostate cancer. He has a history of fluctuating PSA, ranging from 8-12 over the last few years. His most recent PSA of 9.96 by Dr. Dan Europe on 10/09/17, with digital rectal examination performed at that time revealing no nodules.  He underwent prostate MRI on 11/13/17, which revealed PI-RADS 4 and 5: a total of four areas throughout the peripheral zones are of high suspicion for prostate malignancy and possible microextracapsular extension, the 1.4 cm focus in the posterior right peripheral zone at the apex abuts the prosthetic urethra and direct involvement is not excluded, and no evidence for pelvic skeletal metastasis or nodal disease was noted. The patient proceeded to transrectal ultrasound with biopsies of the prostate on 12/25/17.  The prostate volume measured 21 cc.  Out of 16 core biopsies from 4 sections, 12 were positive.  The maximum Gleason score was 3+5, and this was seen in 6 cores from the contralateral area. Gleason 4+3 was also noted in 2 cores from the right apex. Gleason 3+4 was also noted in 8 cores, 6 in ipsilateral and 2 in left apex. Perineural invasion was noted.  The patient reviewed the biopsy results with his urologist and he has kindly been referred today for discussion of potential radiation treatment options.   PREVIOUS RADIATION THERAPY: No  PAST MEDICAL HISTORY:  Past Medical History:  Diagnosis Date  . Arrhythmia    palpitations  . Depression   . GERD (gastroesophageal reflux disease)   . Hyperlipidemia   . Hypertension   . Prostate cancer ([place])   . Ulcer       PAST SURGICAL HISTORY: Past Surgical History:  Procedure Laterality Date  . EUS N/A 03/21/2013   Procedure: UPPER ENDOSCOPIC ULTRASOUND (EUS) LINEAR;  Surgeon: Beryle Beams, MD;  Location: [facility] ENDOSCOPY;  Service: Endoscopy;  Laterality: N/A;  . HERNIA REPAIR  2001  . LAPAROSCOPIC GASTROTOMY W/ REPAIR OF ULCER  1997  . PROSTATE BIOPSY    . VASECTOMY      FAMILY HISTORY:  Family History  Problem Relation Age of Onset  . Arthritis Mother   . Hypertension Mother   . Cancer Mother   . Heart disease Father        CHF  . Hypertension Father   . Diverticulitis Father   . Cancer Father   . Hypertension Brother   . Diabetes Maternal Grandmother   . Colon cancer Paternal Grandmother   . Cancer Paternal Grandmother   . Hyperlipidemia Brother   . Hyperlipidemia Brother   . Hypertension Brother   . Heart disease Brother 73       quadruple bypass, MI  . Diverticulitis Brother   . Heart disease Maternal Grandfather   . Heart disease Paternal Grandfather   . Hyperlipidemia Sister     SOCIAL HISTORY:  Social History   Socioeconomic History  . Marital status: Married    Spouse name: Not on file  .  Number of children: Not on file  . Years of education: Not on file  . Highest education level: Not on file  Occupational History  . Occupation: retired    Fish farm manager: [employer]  Social Needs  . Financial resource strain: Not on file  . Food insecurity:    Worry: Not on file    Inability: Not on file  . Transportation needs:    Medical: Not on file    Non-medical: Not on file  Tobacco Use  . Smoking status: Former Smoker    Types: Cigarettes    Last attempt to quit: 09/22/1970    Years since quitting: 47.4  . Smokeless tobacco: Never Used  Substance and Sexual Activity  . Alcohol use: No  . Drug use: No  . Sexual  activity: Not Currently    Partners: Female  Lifestyle  . Physical activity:    Days per week: Not on file    Minutes per session: Not on file  . Stress: Not on file  Relationships  . Social connections:    Talks on phone: Not on file    Gets together: Not on file    Attends religious service: Not on file    Active member of club or organization: Not on file    Attends meetings of clubs or organizations: Not on file    Relationship status: Not on file  . Intimate partner violence:    Fear of current or ex partner: Not on file    Emotionally abused: Not on file    Physically abused: Not on file    Forced sexual activity: Not on file  Other Topics Concern  . Not on file  Social History Narrative   Exercise--  no  The patient is married and lives in [place].   ALLERGIES: Codeine and Penicillins  MEDICATIONS:  Current Outpatient Medications  Medication Sig Dispense Refill  . acetaminophen (TYLENOL) 500 MG tablet Take 500 mg by mouth daily.    Marland Kitchen aspirin 81 MG tablet Take 81 mg by mouth daily.     Marland Kitchen atorvastatin (LIPITOR) 80 MG tablet Take 80 mg by mouth daily. TAKE ONE-HALF TABLET BY MOUTH AT BEDTIME FOR CHOLESTEROL.    . Cholecalciferol 2000 units TABS Take 2 tablets by mouth daily.    . cyclobenzaprine (FLEXERIL) 5 MG tablet Take 1 tablet (5 mg total) by mouth at bedtime. 7 tablet 0  . fish oil-omega-3 fatty acids 1000 MG capsule Take 1 g by mouth daily.     . hydrocortisone 2.5 % cream APPLY SMALL AMOUNT TO AFFECTED AREA TWICE A DAY AS NEEDED USE MIXED WITH LAMISIL CREAM TWICE A DAY ON AFFECTED AREA.    Marland Kitchen metoprolol (LOPRESSOR) 50 MG tablet Take 50 mg by mouth 2 (two) times daily.     . Multiple Vitamin (MULTIVITAMIN) tablet Take 1 tablet by mouth daily.     Marland Kitchen NITROSTAT 0.4 MG SL tablet Place 0.4 mg under the tongue every 5 (five) minutes as needed for chest pain.     Marland Kitchen PSYLLIUM HUSK PO TAKE 1 TEASPOONFUL BY  MOUTH DAILY.  (MIX IN 8 OUNCES OF WATER OR JUICE AND DRINK).    .  sildenafil (VIAGRA) 100 MG tablet Take 100 mg by mouth daily as needed for erectile dysfunction. TAKE ONE-HALF TABLET BY MOUTH AS INSTRUCTED. (TAKE 1 HOUR PRIOR TO SEXUAL ACTIVITY *DO NOT EXCEED 1 DOSE PER 24 HOUR PERIOD*)    . terbinafine (LAMISIL) 1 % cream APPLY SMALL AMOUNT TO  AFFECTED AREA TWICE A DAY TO GROIN.     No current facility-administered medications for this encounter.     REVIEW OF SYSTEMS:  On review of systems, the patient reports that he is doing well overall. He denies any chest pain, shortness of breath, cough, fevers, chills, night sweats, unintended weight changes. He denies any bowel disturbances, and denies abdominal pain, nausea or vomiting. He denies any new musculoskeletal or joint aches or pains. His IPSS was 18, indicating moderate urinary symptoms. He is able to complete sexual activity with some attempts. A complete review of systems is obtained and is otherwise negative.    PHYSICAL EXAM:  Wt Readings from Last 3 Encounters:  02/18/18 169 lb 2 oz (76.7 kg)  04/17/17 183 lb 6.4 oz (83.2 kg)  04/10/17 183 lb (83 kg)   Temp Readings from Last 3 Encounters:  02/18/18 98.3 F (36.8 C) (Oral)  04/17/17 98.1 F (36.7 C) (Oral)  04/10/17 98.1 F (36.7 C) (Oral)   BP Readings from Last 3 Encounters:  02/18/18 128/73  04/17/17 124/71  04/10/17 134/71   Pulse Readings from Last 3 Encounters:  02/18/18 (!) 56  04/17/17 (!) 56  04/10/17 (!) 52   Pain Assessment Pain Score: 0-No pain/10  In general this is a well appearing Caucasian gentleman in no acute distress. He is alert and oriented x4 and appropriate throughout the examination. HEENT reveals that the patient is normocephalic, atraumatic. EOMs are intact.  Skin is intact without any evidence of gross lesions. Cardiopulmonary assessment is negative for acute distress and he exhibits normal effort.    KPS = 100  100 - Normal; no complaints; no evidence of disease. 90   - Able to carry on normal  activity; minor signs or symptoms of disease. 80   - Normal activity with effort; some signs or symptoms of disease. 18   - Cares for self; unable to carry on normal activity or to do active work. 60   - Requires occasional assistance, but is able to care for most of his personal needs. 50   - Requires considerable assistance and frequent medical care. 38   - Disabled; requires special care and assistance. 42   - Severely disabled; hospital admission is indicated although death not imminent. 58   - Very sick; hospital admission necessary; active supportive treatment necessary. 10   - Moribund; fatal processes progressing rapidly. 0     - Dead  Karnofsky DA, Abelmann [place], Craver LS and Burchenal [facility] [phone]) The use of the nitrogen mustards in the palliative treatment of carcinoma: with particular reference to bronchogenic carcinoma Cancer 1 634-56  LABORATORY DATA:  Lab Results  Component Value Date   WBC 5.4 02/21/2012   HGB 15.6 02/21/2012   HCT 45.7 02/21/2012   MCV 94.6 02/21/2012   PLT 215.0 02/21/2012   Lab Results  Component Value Date   NA 143 09/22/2010   K 4.3 09/22/2010   CL 104 09/22/2010   CO2 30 09/22/2010   Lab Results  Component Value Date   ALT 28 09/22/2010   AST 25 09/22/2010   ALKPHOS 79 09/22/2010   BILITOT 0.7 09/22/2010     RADIOGRAPHY: No results found.    IMPRESSION/PLAN: 1. 72 y.o. 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 We discussed the patient's workup and outlines the nature of prostate cancer in this setting. The patient's T stage, Gleason's score, and PSA put  him into the high risk group. He is eligible for 8 weeks of external radiation with two years of ADT. He is not eligible for boost due to his urinary symptoms and prostatic volume. We discussed the available radiation techniques, and focused on the details and logistics and delivery. We discussed and outlined the risks,  benefits, short and long-term effects associated with radiotherapy. We also detailed the role of ADT in the treatment of high risk prostate cancer and outlined the associated side effects that could be expected with this therapy.  At the end of our conversation, the patient opted to proceed with 8 weeks of external beam therapy in combination with ADT. He has not received his first Lupron injection. We will contact Dr. Dan Europe at the [facility] to make arrangements for start of ADT and fiducial marker placement prior to simulation. He will be scheduled for simulation in the near future. We will share our discussion with Dr. Dan Europe and move forward with treatment planning in anticipation of beginning IMRT in the near future.  In a visit lasting 60 minutes, greater than 50% of the time was spent face to face discussing his case, and coordinating the patient's care.    Carola Rhine, [facility]   Page Me  Seen with  _____________________________________  Sheral Apley Tammi Klippel, M.D.   This document serves as a record of services personally performed by Shona Simpson, PA-C and Dr. Tammi Klippel. It was created on their behalf by Wilburn Mylar, a trained medical scribe. The creation of this record is based on the scribe's personal observations and the provider's statements to them. This document has been checked and approved by the attending provider.

## 2018-02-19 DIAGNOSIS — C61 Malignant neoplasm of prostate: Secondary | ICD-10-CM | POA: Insufficient documentation

## 2018-04-12 ENCOUNTER — Ambulatory Visit
Admission: RE | Admit: 2018-04-12 | Discharge: 2018-04-12 | Disposition: A | Discharge status: Home / Self Care | Payer: No Typology Code available for payment source | Source: Ambulatory Visit | Attending: Radiation Oncology | Admitting: Radiation Oncology

## 2018-04-12 ENCOUNTER — Other Ambulatory Visit: Payer: Self-pay | Admitting: Medical Oncology

## 2018-04-12 ENCOUNTER — Encounter: Payer: Self-pay | Admitting: Medical Oncology

## 2018-04-12 DIAGNOSIS — Z87891 Personal history of nicotine dependence: Secondary | ICD-10-CM | POA: Diagnosis not present

## 2018-04-12 DIAGNOSIS — C61 Malignant neoplasm of prostate: Secondary | ICD-10-CM | POA: Insufficient documentation

## 2018-04-12 DIAGNOSIS — Z79899 Other long term (current) drug therapy: Secondary | ICD-10-CM | POA: Insufficient documentation

## 2018-04-12 DIAGNOSIS — I10 Essential (primary) hypertension: Secondary | ICD-10-CM | POA: Diagnosis not present

## 2018-04-12 NOTE — Progress Notes (Signed)
[name] here for CT simulation. He is concerned because he has not had his bone scan scheduled. He states he was told he needed the scan before he could start radiation. I spoke with Dr. Tammi Klippel and he says that patient can go ahead with planning and radiation but he need scans. He relayed this message to patient while getting consent signed. Orders placed for scan.

## 2018-04-12 NOTE — Addendum Note (Signed)
Addended by: Tyler Pita on: 04/12/2018 05:47 PM   Modules accepted: Orders

## 2018-04-12 NOTE — Progress Notes (Signed)
  Radiation Oncology         [phone] ________________________________  Name: [name] MRN: [id number]  Date: 04/12/2018  DOB: [birth year]  SIMULATION AND TREATMENT PLANNING NOTE    ICD-10-CM   1. Malignant neoplasm of prostate ([place]) C61     DIAGNOSIS:  73 y.o. 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  Identity was confirmed.  All relevant records and images related to the planned course of therapy were reviewed.  The patient freely provided informed written consent to proceed with treatment after reviewing the details related to the planned course of therapy. The consent form was witnessed and verified by the simulation staff.  Then, the patient was set-up in a stable reproducible supine position for radiation therapy.  A vacuum lock pillow device was custom fabricated to position his legs in a reproducible immobilized position.  Then, I performed a urethrogram under sterile conditions to identify the prostatic apex.  CT images were obtained.  Surface markings were placed.  The CT images were loaded into the planning software.  Then the prostate target and avoidance structures including the rectum, bladder, bowel and hips were contoured.  Treatment planning then occurred.  The radiation prescription was entered and confirmed.  A total of one complex treatment devices were fabricated. I have requested : Intensity Modulated Radiotherapy (IMRT) is medically necessary for this case for the following reason:  Rectal sparing.Marland Kitchen  PLAN:  The patient will receive 45 Gy in 25 fractions of 1.8 Gy, followed by a boost to the prostate to a total dose of 75 Gy with 15 additional fractions of 2.0 Gy.  ________________________________  Sheral Apley Tammi Klippel, M.D.

## 2018-04-15 ENCOUNTER — Other Ambulatory Visit: Payer: Self-pay | Admitting: Medical Oncology

## 2018-04-18 DIAGNOSIS — C61 Malignant neoplasm of prostate: Secondary | ICD-10-CM | POA: Diagnosis not present

## 2018-04-18 DIAGNOSIS — Z87891 Personal history of nicotine dependence: Secondary | ICD-10-CM | POA: Diagnosis not present

## 2018-04-18 DIAGNOSIS — Z79899 Other long term (current) drug therapy: Secondary | ICD-10-CM | POA: Diagnosis not present

## 2018-04-18 DIAGNOSIS — I10 Essential (primary) hypertension: Secondary | ICD-10-CM | POA: Diagnosis not present

## 2018-04-22 ENCOUNTER — Ambulatory Visit
Admission: RE | Admit: 2018-04-22 | Discharge: 2018-04-22 | Disposition: A | Discharge status: Home / Self Care | Payer: No Typology Code available for payment source | Source: Ambulatory Visit | Attending: Radiation Oncology | Admitting: Radiation Oncology

## 2018-04-22 DIAGNOSIS — Z87891 Personal history of nicotine dependence: Secondary | ICD-10-CM | POA: Diagnosis not present

## 2018-04-22 DIAGNOSIS — Z79899 Other long term (current) drug therapy: Secondary | ICD-10-CM | POA: Diagnosis not present

## 2018-04-22 DIAGNOSIS — I10 Essential (primary) hypertension: Secondary | ICD-10-CM | POA: Diagnosis not present

## 2018-04-22 DIAGNOSIS — C61 Malignant neoplasm of prostate: Secondary | ICD-10-CM | POA: Diagnosis not present

## 2018-04-23 ENCOUNTER — Ambulatory Visit
Admission: RE | Admit: 2018-04-23 | Discharge: 2018-04-23 | Disposition: A | Discharge status: Home / Self Care | Payer: No Typology Code available for payment source | Source: Ambulatory Visit | Attending: Radiation Oncology | Admitting: Radiation Oncology

## 2018-04-23 DIAGNOSIS — Z87891 Personal history of nicotine dependence: Secondary | ICD-10-CM | POA: Diagnosis not present

## 2018-04-23 DIAGNOSIS — Z79899 Other long term (current) drug therapy: Secondary | ICD-10-CM | POA: Diagnosis not present

## 2018-04-23 DIAGNOSIS — I10 Essential (primary) hypertension: Secondary | ICD-10-CM | POA: Diagnosis not present

## 2018-04-23 DIAGNOSIS — C61 Malignant neoplasm of prostate: Secondary | ICD-10-CM | POA: Diagnosis not present

## 2018-04-24 ENCOUNTER — Ambulatory Visit
Admission: RE | Admit: 2018-04-24 | Discharge: 2018-04-24 | Disposition: A | Discharge status: Home / Self Care | Payer: No Typology Code available for payment source | Source: Ambulatory Visit | Attending: Radiation Oncology | Admitting: Radiation Oncology

## 2018-04-24 DIAGNOSIS — Z87891 Personal history of nicotine dependence: Secondary | ICD-10-CM | POA: Diagnosis not present

## 2018-04-24 DIAGNOSIS — C61 Malignant neoplasm of prostate: Secondary | ICD-10-CM | POA: Diagnosis not present

## 2018-04-24 DIAGNOSIS — Z79899 Other long term (current) drug therapy: Secondary | ICD-10-CM | POA: Diagnosis not present

## 2018-04-24 DIAGNOSIS — I10 Essential (primary) hypertension: Secondary | ICD-10-CM | POA: Diagnosis not present

## 2018-04-24 DIAGNOSIS — R002 Palpitations: Secondary | ICD-10-CM | POA: Diagnosis not present

## 2018-04-24 DIAGNOSIS — E785 Hyperlipidemia, unspecified: Secondary | ICD-10-CM | POA: Diagnosis not present

## 2018-04-25 ENCOUNTER — Telehealth: Payer: Self-pay | Admitting: Medical Oncology

## 2018-04-25 ENCOUNTER — Encounter: Payer: Self-pay | Admitting: Medical Oncology

## 2018-04-25 ENCOUNTER — Ambulatory Visit
Admission: RE | Admit: 2018-04-25 | Discharge: 2018-04-25 | Disposition: A | Discharge status: Home / Self Care | Payer: No Typology Code available for payment source | Source: Ambulatory Visit | Attending: Radiation Oncology | Admitting: Radiation Oncology

## 2018-04-25 DIAGNOSIS — Z87891 Personal history of nicotine dependence: Secondary | ICD-10-CM | POA: Diagnosis not present

## 2018-04-25 DIAGNOSIS — C61 Malignant neoplasm of prostate: Secondary | ICD-10-CM | POA: Diagnosis not present

## 2018-04-25 DIAGNOSIS — Z79899 Other long term (current) drug therapy: Secondary | ICD-10-CM | POA: Diagnosis not present

## 2018-04-25 DIAGNOSIS — I10 Essential (primary) hypertension: Secondary | ICD-10-CM | POA: Diagnosis not present

## 2018-04-25 NOTE — Progress Notes (Signed)
Mr. Filippi started radiation 2/17 and states he is doing well. He has not been scheduled for his bone scan that was ordered 2/7. I will follow up with radiation.

## 2018-04-25 NOTE — Telephone Encounter (Signed)
Called patient and spoke with his wife to inform her of bone scan March 4. Arriving at [facility] 11:45 am to receive injection and returning at 2:30 for scan. She voiced understanding.

## 2018-04-26 ENCOUNTER — Ambulatory Visit
Admission: RE | Admit: 2018-04-26 | Discharge: 2018-04-26 | Disposition: A | Discharge status: Home / Self Care | Payer: No Typology Code available for payment source | Source: Ambulatory Visit | Attending: Radiation Oncology | Admitting: Radiation Oncology

## 2018-04-26 DIAGNOSIS — Z79899 Other long term (current) drug therapy: Secondary | ICD-10-CM | POA: Diagnosis not present

## 2018-04-26 DIAGNOSIS — I10 Essential (primary) hypertension: Secondary | ICD-10-CM | POA: Diagnosis not present

## 2018-04-26 DIAGNOSIS — Z87891 Personal history of nicotine dependence: Secondary | ICD-10-CM | POA: Diagnosis not present

## 2018-04-26 DIAGNOSIS — C61 Malignant neoplasm of prostate: Secondary | ICD-10-CM | POA: Diagnosis not present

## 2018-04-29 ENCOUNTER — Ambulatory Visit
Admission: RE | Admit: 2018-04-29 | Discharge: 2018-04-29 | Disposition: A | Discharge status: Home / Self Care | Payer: No Typology Code available for payment source | Source: Ambulatory Visit | Attending: Radiation Oncology | Admitting: Radiation Oncology

## 2018-04-29 DIAGNOSIS — Z87891 Personal history of nicotine dependence: Secondary | ICD-10-CM | POA: Diagnosis not present

## 2018-04-29 DIAGNOSIS — C61 Malignant neoplasm of prostate: Secondary | ICD-10-CM | POA: Diagnosis not present

## 2018-04-29 DIAGNOSIS — Z79899 Other long term (current) drug therapy: Secondary | ICD-10-CM | POA: Diagnosis not present

## 2018-04-29 DIAGNOSIS — I10 Essential (primary) hypertension: Secondary | ICD-10-CM | POA: Diagnosis not present

## 2018-04-30 ENCOUNTER — Ambulatory Visit
Admission: RE | Admit: 2018-04-30 | Discharge: 2018-04-30 | Disposition: A | Discharge status: Home / Self Care | Payer: No Typology Code available for payment source | Source: Ambulatory Visit | Attending: Radiation Oncology | Admitting: Radiation Oncology

## 2018-04-30 DIAGNOSIS — Z79899 Other long term (current) drug therapy: Secondary | ICD-10-CM | POA: Diagnosis not present

## 2018-04-30 DIAGNOSIS — C61 Malignant neoplasm of prostate: Secondary | ICD-10-CM | POA: Diagnosis not present

## 2018-04-30 DIAGNOSIS — I10 Essential (primary) hypertension: Secondary | ICD-10-CM | POA: Diagnosis not present

## 2018-04-30 DIAGNOSIS — Z87891 Personal history of nicotine dependence: Secondary | ICD-10-CM | POA: Diagnosis not present

## 2018-05-01 ENCOUNTER — Ambulatory Visit
Admission: RE | Admit: 2018-05-01 | Discharge: 2018-05-01 | Disposition: A | Discharge status: Home / Self Care | Payer: No Typology Code available for payment source | Source: Ambulatory Visit | Attending: Radiation Oncology | Admitting: Radiation Oncology

## 2018-05-01 DIAGNOSIS — Z79899 Other long term (current) drug therapy: Secondary | ICD-10-CM | POA: Diagnosis not present

## 2018-05-01 DIAGNOSIS — Z87891 Personal history of nicotine dependence: Secondary | ICD-10-CM | POA: Diagnosis not present

## 2018-05-01 DIAGNOSIS — I10 Essential (primary) hypertension: Secondary | ICD-10-CM | POA: Diagnosis not present

## 2018-05-01 DIAGNOSIS — C61 Malignant neoplasm of prostate: Secondary | ICD-10-CM | POA: Diagnosis not present

## 2018-05-02 ENCOUNTER — Ambulatory Visit
Admission: RE | Admit: 2018-05-02 | Discharge: 2018-05-02 | Disposition: A | Discharge status: Home / Self Care | Payer: No Typology Code available for payment source | Source: Ambulatory Visit | Attending: Radiation Oncology | Admitting: Radiation Oncology

## 2018-05-02 DIAGNOSIS — C61 Malignant neoplasm of prostate: Secondary | ICD-10-CM | POA: Diagnosis not present

## 2018-05-02 DIAGNOSIS — Z87891 Personal history of nicotine dependence: Secondary | ICD-10-CM | POA: Diagnosis not present

## 2018-05-02 DIAGNOSIS — I10 Essential (primary) hypertension: Secondary | ICD-10-CM | POA: Diagnosis not present

## 2018-05-02 DIAGNOSIS — Z79899 Other long term (current) drug therapy: Secondary | ICD-10-CM | POA: Diagnosis not present

## 2018-05-03 ENCOUNTER — Ambulatory Visit
Admission: RE | Admit: 2018-05-03 | Discharge: 2018-05-03 | Disposition: A | Discharge status: Home / Self Care | Payer: No Typology Code available for payment source | Source: Ambulatory Visit | Attending: Radiation Oncology | Admitting: Radiation Oncology

## 2018-05-03 DIAGNOSIS — Z79899 Other long term (current) drug therapy: Secondary | ICD-10-CM | POA: Diagnosis not present

## 2018-05-03 DIAGNOSIS — C61 Malignant neoplasm of prostate: Secondary | ICD-10-CM | POA: Diagnosis not present

## 2018-05-03 DIAGNOSIS — I10 Essential (primary) hypertension: Secondary | ICD-10-CM | POA: Diagnosis not present

## 2018-05-03 DIAGNOSIS — Z87891 Personal history of nicotine dependence: Secondary | ICD-10-CM | POA: Diagnosis not present

## 2018-05-06 ENCOUNTER — Ambulatory Visit
Admission: RE | Admit: 2018-05-06 | Discharge: 2018-05-06 | Disposition: A | Discharge status: Home / Self Care | Payer: No Typology Code available for payment source | Source: Ambulatory Visit | Attending: Radiation Oncology | Admitting: Radiation Oncology

## 2018-05-06 DIAGNOSIS — Z79899 Other long term (current) drug therapy: Secondary | ICD-10-CM | POA: Insufficient documentation

## 2018-05-06 DIAGNOSIS — I10 Essential (primary) hypertension: Secondary | ICD-10-CM | POA: Insufficient documentation

## 2018-05-06 DIAGNOSIS — C61 Malignant neoplasm of prostate: Secondary | ICD-10-CM | POA: Diagnosis not present

## 2018-05-06 DIAGNOSIS — Z87891 Personal history of nicotine dependence: Secondary | ICD-10-CM | POA: Diagnosis not present

## 2018-05-07 ENCOUNTER — Ambulatory Visit
Admission: RE | Admit: 2018-05-07 | Discharge: 2018-05-07 | Disposition: A | Discharge status: Home / Self Care | Payer: No Typology Code available for payment source | Source: Ambulatory Visit | Attending: Radiation Oncology | Admitting: Radiation Oncology

## 2018-05-08 ENCOUNTER — Ambulatory Visit
Admission: RE | Admit: 2018-05-08 | Discharge: 2018-05-08 | Disposition: A | Discharge status: Home / Self Care | Payer: No Typology Code available for payment source | Source: Ambulatory Visit | Attending: Radiation Oncology | Admitting: Radiation Oncology

## 2018-05-08 ENCOUNTER — Encounter (HOSPITAL_COMMUNITY)
Admission: RE | Admit: 2018-05-08 | Discharge: 2018-05-08 | Disposition: A | Discharge status: Home / Self Care | Payer: No Typology Code available for payment source | Source: Ambulatory Visit | Attending: Radiation Oncology | Admitting: Radiation Oncology

## 2018-05-08 DIAGNOSIS — C61 Malignant neoplasm of prostate: Secondary | ICD-10-CM | POA: Diagnosis present

## 2018-05-08 MED ORDER — TECHNETIUM TC 99M MEDRONATE IV KIT
20.0000 | PACK | Freq: Once | INTRAVENOUS | Status: AC | PRN
  Administered 2018-05-08: 20 via INTRAVENOUS

## 2018-05-09 ENCOUNTER — Ambulatory Visit
Admission: RE | Admit: 2018-05-09 | Discharge: 2018-05-09 | Disposition: A | Discharge status: Home / Self Care | Payer: No Typology Code available for payment source | Source: Ambulatory Visit | Attending: Radiation Oncology | Admitting: Radiation Oncology

## 2018-05-09 ENCOUNTER — Encounter: Payer: Self-pay | Admitting: Medical Oncology

## 2018-05-09 DIAGNOSIS — C61 Malignant neoplasm of prostate: Secondary | ICD-10-CM | POA: Diagnosis not present

## 2018-05-10 ENCOUNTER — Ambulatory Visit: Admission: RE | Admit: 2018-05-10 | Payer: No Typology Code available for payment source | Source: Ambulatory Visit

## 2018-05-10 ENCOUNTER — Other Ambulatory Visit: Payer: Self-pay | Admitting: Radiation Oncology

## 2018-05-10 ENCOUNTER — Ambulatory Visit
Admission: RE | Admit: 2018-05-10 | Discharge: 2018-05-10 | Disposition: A | Discharge status: Home / Self Care | Payer: No Typology Code available for payment source | Source: Ambulatory Visit | Attending: Radiation Oncology | Admitting: Radiation Oncology

## 2018-05-10 ENCOUNTER — Telehealth: Payer: Self-pay | Admitting: Radiation Oncology

## 2018-05-10 DIAGNOSIS — K921 Melena: Secondary | ICD-10-CM

## 2018-05-10 DIAGNOSIS — C61 Malignant neoplasm of prostate: Secondary | ICD-10-CM | POA: Diagnosis not present

## 2018-05-10 LAB — CBC (CANCER CENTER ONLY)
HEMATOCRIT: 32.9 % — AB (ref 39.0–52.0)
HEMOGLOBIN: 11.4 g/dL — AB (ref 13.0–17.0)
MCH: 32.9 pg (ref 26.0–34.0)
MCHC: 34.7 g/dL (ref 30.0–36.0)
MCV: 94.8 fL (ref 80.0–100.0)
Platelet Count: 94 10*3/uL — ABNORMAL LOW (ref 150–400)
RBC: 3.47 MIL/uL — AB (ref 4.22–5.81)
RDW: 13.7 % (ref 11.5–15.5)
WBC Count: 5.1 10*3/uL (ref 4.0–10.5)
nRBC: 0 % (ref 0.0–0.2)

## 2018-05-10 NOTE — Telephone Encounter (Signed)
Phoned patient per Dr. Johny Shears request. No answer at either number. Left detailed message cell phone voicemail. Explained he is slightly anemic per Dr. Tammi Klippel and his lab results. Explained that stopping the aspirin should help resolve the black tarry stools he is having. Encouraged patient to contact this RN at [phone] if not.

## 2018-05-13 ENCOUNTER — Ambulatory Visit
Admission: RE | Admit: 2018-05-13 | Discharge: 2018-05-13 | Disposition: A | Discharge status: Home / Self Care | Payer: No Typology Code available for payment source | Source: Ambulatory Visit | Attending: Radiation Oncology | Admitting: Radiation Oncology

## 2018-05-13 DIAGNOSIS — C61 Malignant neoplasm of prostate: Secondary | ICD-10-CM | POA: Diagnosis not present

## 2018-05-13 NOTE — Progress Notes (Signed)
[name] states radiation going well and states he had bone scan yesterday.

## 2018-05-14 ENCOUNTER — Ambulatory Visit
Admission: RE | Admit: 2018-05-14 | Discharge: 2018-05-14 | Disposition: A | Discharge status: Home / Self Care | Payer: No Typology Code available for payment source | Source: Ambulatory Visit | Attending: Radiation Oncology | Admitting: Radiation Oncology

## 2018-05-14 DIAGNOSIS — C61 Malignant neoplasm of prostate: Secondary | ICD-10-CM | POA: Diagnosis not present

## 2018-05-15 ENCOUNTER — Other Ambulatory Visit: Payer: Self-pay

## 2018-05-15 ENCOUNTER — Ambulatory Visit
Admission: RE | Admit: 2018-05-15 | Discharge: 2018-05-15 | Disposition: A | Discharge status: Home / Self Care | Payer: No Typology Code available for payment source | Source: Ambulatory Visit | Attending: Radiation Oncology | Admitting: Radiation Oncology

## 2018-05-15 DIAGNOSIS — C61 Malignant neoplasm of prostate: Secondary | ICD-10-CM | POA: Diagnosis not present

## 2018-05-16 ENCOUNTER — Ambulatory Visit
Admission: RE | Admit: 2018-05-16 | Discharge: 2018-05-16 | Disposition: A | Discharge status: Home / Self Care | Payer: No Typology Code available for payment source | Source: Ambulatory Visit | Attending: Radiation Oncology | Admitting: Radiation Oncology

## 2018-05-16 ENCOUNTER — Other Ambulatory Visit: Payer: Self-pay

## 2018-05-16 DIAGNOSIS — C61 Malignant neoplasm of prostate: Secondary | ICD-10-CM | POA: Diagnosis not present

## 2018-05-17 ENCOUNTER — Ambulatory Visit
Admission: RE | Admit: 2018-05-17 | Discharge: 2018-05-17 | Disposition: A | Discharge status: Home / Self Care | Payer: No Typology Code available for payment source | Source: Ambulatory Visit | Attending: Radiation Oncology | Admitting: Radiation Oncology

## 2018-05-17 ENCOUNTER — Other Ambulatory Visit: Payer: Self-pay

## 2018-05-17 DIAGNOSIS — C61 Malignant neoplasm of prostate: Secondary | ICD-10-CM | POA: Diagnosis not present

## 2018-05-20 ENCOUNTER — Ambulatory Visit
Admission: RE | Admit: 2018-05-20 | Discharge: 2018-05-20 | Disposition: A | Discharge status: Home / Self Care | Payer: No Typology Code available for payment source | Source: Ambulatory Visit | Attending: Radiation Oncology | Admitting: Radiation Oncology

## 2018-05-20 ENCOUNTER — Other Ambulatory Visit: Payer: Self-pay

## 2018-05-20 DIAGNOSIS — C61 Malignant neoplasm of prostate: Secondary | ICD-10-CM | POA: Diagnosis not present

## 2018-05-21 ENCOUNTER — Ambulatory Visit
Admission: RE | Admit: 2018-05-21 | Discharge: 2018-05-21 | Disposition: A | Discharge status: Home / Self Care | Payer: No Typology Code available for payment source | Source: Ambulatory Visit | Attending: Radiation Oncology | Admitting: Radiation Oncology

## 2018-05-21 DIAGNOSIS — C61 Malignant neoplasm of prostate: Secondary | ICD-10-CM | POA: Diagnosis not present

## 2018-05-22 ENCOUNTER — Ambulatory Visit
Admission: RE | Admit: 2018-05-22 | Discharge: 2018-05-22 | Disposition: A | Discharge status: Home / Self Care | Payer: No Typology Code available for payment source | Source: Ambulatory Visit | Attending: Radiation Oncology | Admitting: Radiation Oncology

## 2018-05-22 DIAGNOSIS — C61 Malignant neoplasm of prostate: Secondary | ICD-10-CM | POA: Diagnosis not present

## 2018-05-23 ENCOUNTER — Other Ambulatory Visit: Payer: Self-pay

## 2018-05-23 ENCOUNTER — Ambulatory Visit
Admission: RE | Admit: 2018-05-23 | Discharge: 2018-05-23 | Disposition: A | Discharge status: Home / Self Care | Payer: No Typology Code available for payment source | Source: Ambulatory Visit | Attending: Radiation Oncology | Admitting: Radiation Oncology

## 2018-05-23 DIAGNOSIS — C61 Malignant neoplasm of prostate: Secondary | ICD-10-CM | POA: Diagnosis not present

## 2018-05-24 ENCOUNTER — Other Ambulatory Visit: Payer: Self-pay

## 2018-05-24 ENCOUNTER — Ambulatory Visit
Admission: RE | Admit: 2018-05-24 | Discharge: 2018-05-24 | Disposition: A | Discharge status: Home / Self Care | Payer: No Typology Code available for payment source | Source: Ambulatory Visit | Attending: Radiation Oncology | Admitting: Radiation Oncology

## 2018-05-24 DIAGNOSIS — C61 Malignant neoplasm of prostate: Secondary | ICD-10-CM | POA: Diagnosis not present

## 2018-05-27 ENCOUNTER — Other Ambulatory Visit: Payer: Self-pay

## 2018-05-27 ENCOUNTER — Ambulatory Visit
Admission: RE | Admit: 2018-05-27 | Discharge: 2018-05-27 | Disposition: A | Discharge status: Home / Self Care | Payer: No Typology Code available for payment source | Source: Ambulatory Visit | Attending: Radiation Oncology | Admitting: Radiation Oncology

## 2018-05-27 ENCOUNTER — Ambulatory Visit: Payer: No Typology Code available for payment source

## 2018-05-27 DIAGNOSIS — C61 Malignant neoplasm of prostate: Secondary | ICD-10-CM | POA: Diagnosis not present

## 2018-05-28 ENCOUNTER — Ambulatory Visit
Admission: RE | Admit: 2018-05-28 | Discharge: 2018-05-28 | Disposition: A | Discharge status: Home / Self Care | Payer: No Typology Code available for payment source | Source: Ambulatory Visit | Attending: Radiation Oncology | Admitting: Radiation Oncology

## 2018-05-28 ENCOUNTER — Ambulatory Visit: Payer: No Typology Code available for payment source

## 2018-05-28 ENCOUNTER — Other Ambulatory Visit: Payer: Self-pay

## 2018-05-28 DIAGNOSIS — C61 Malignant neoplasm of prostate: Secondary | ICD-10-CM | POA: Diagnosis not present

## 2018-05-29 ENCOUNTER — Other Ambulatory Visit: Payer: Self-pay

## 2018-05-29 ENCOUNTER — Ambulatory Visit
Admission: RE | Admit: 2018-05-29 | Discharge: 2018-05-29 | Disposition: A | Discharge status: Home / Self Care | Payer: No Typology Code available for payment source | Source: Ambulatory Visit | Attending: Radiation Oncology | Admitting: Radiation Oncology

## 2018-05-29 DIAGNOSIS — C61 Malignant neoplasm of prostate: Secondary | ICD-10-CM | POA: Diagnosis not present

## 2018-05-30 ENCOUNTER — Other Ambulatory Visit: Payer: Self-pay

## 2018-05-30 ENCOUNTER — Ambulatory Visit
Admission: RE | Admit: 2018-05-30 | Discharge: 2018-05-30 | Disposition: A | Discharge status: Home / Self Care | Payer: No Typology Code available for payment source | Source: Ambulatory Visit | Attending: Radiation Oncology | Admitting: Radiation Oncology

## 2018-05-30 DIAGNOSIS — C61 Malignant neoplasm of prostate: Secondary | ICD-10-CM | POA: Diagnosis not present

## 2018-05-31 ENCOUNTER — Encounter: Payer: Self-pay | Admitting: Radiation Oncology

## 2018-05-31 ENCOUNTER — Ambulatory Visit
Admission: RE | Admit: 2018-05-31 | Discharge: 2018-05-31 | Disposition: A | Discharge status: Home / Self Care | Payer: No Typology Code available for payment source | Source: Ambulatory Visit | Attending: Radiation Oncology | Admitting: Radiation Oncology

## 2018-05-31 ENCOUNTER — Other Ambulatory Visit: Payer: Self-pay

## 2018-05-31 DIAGNOSIS — C61 Malignant neoplasm of prostate: Secondary | ICD-10-CM | POA: Diagnosis not present

## 2018-06-03 ENCOUNTER — Ambulatory Visit
Admission: RE | Admit: 2018-06-03 | Discharge: 2018-06-03 | Disposition: A | Discharge status: Home / Self Care | Payer: No Typology Code available for payment source | Source: Ambulatory Visit | Attending: Radiation Oncology | Admitting: Radiation Oncology

## 2018-06-03 ENCOUNTER — Other Ambulatory Visit: Payer: Self-pay

## 2018-06-03 DIAGNOSIS — C61 Malignant neoplasm of prostate: Secondary | ICD-10-CM | POA: Diagnosis not present

## 2018-06-04 ENCOUNTER — Other Ambulatory Visit: Payer: Self-pay

## 2018-06-04 ENCOUNTER — Ambulatory Visit
Admission: RE | Admit: 2018-06-04 | Discharge: 2018-06-04 | Disposition: A | Discharge status: Home / Self Care | Payer: No Typology Code available for payment source | Source: Ambulatory Visit | Attending: Radiation Oncology | Admitting: Radiation Oncology

## 2018-06-04 DIAGNOSIS — C61 Malignant neoplasm of prostate: Secondary | ICD-10-CM | POA: Diagnosis not present

## 2018-06-05 ENCOUNTER — Ambulatory Visit
Admission: RE | Admit: 2018-06-05 | Discharge: 2018-06-05 | Disposition: A | Discharge status: Home / Self Care | Payer: No Typology Code available for payment source | Source: Ambulatory Visit | Attending: Radiation Oncology | Admitting: Radiation Oncology

## 2018-06-05 ENCOUNTER — Other Ambulatory Visit: Payer: Self-pay

## 2018-06-05 DIAGNOSIS — Z87891 Personal history of nicotine dependence: Secondary | ICD-10-CM | POA: Insufficient documentation

## 2018-06-05 DIAGNOSIS — I10 Essential (primary) hypertension: Secondary | ICD-10-CM | POA: Insufficient documentation

## 2018-06-05 DIAGNOSIS — Z79899 Other long term (current) drug therapy: Secondary | ICD-10-CM | POA: Insufficient documentation

## 2018-06-05 DIAGNOSIS — C61 Malignant neoplasm of prostate: Secondary | ICD-10-CM | POA: Insufficient documentation

## 2018-06-06 ENCOUNTER — Ambulatory Visit
Admission: RE | Admit: 2018-06-06 | Discharge: 2018-06-06 | Disposition: A | Discharge status: Home / Self Care | Payer: No Typology Code available for payment source | Source: Ambulatory Visit | Attending: Radiation Oncology | Admitting: Radiation Oncology

## 2018-06-06 ENCOUNTER — Other Ambulatory Visit: Payer: Self-pay

## 2018-06-06 DIAGNOSIS — C61 Malignant neoplasm of prostate: Secondary | ICD-10-CM | POA: Diagnosis not present

## 2018-06-07 ENCOUNTER — Other Ambulatory Visit: Payer: Self-pay

## 2018-06-07 ENCOUNTER — Ambulatory Visit
Admission: RE | Admit: 2018-06-07 | Discharge: 2018-06-07 | Disposition: A | Discharge status: Home / Self Care | Payer: No Typology Code available for payment source | Source: Ambulatory Visit | Attending: Radiation Oncology | Admitting: Radiation Oncology

## 2018-06-07 DIAGNOSIS — C61 Malignant neoplasm of prostate: Secondary | ICD-10-CM | POA: Diagnosis not present

## 2018-06-10 ENCOUNTER — Other Ambulatory Visit: Payer: Self-pay

## 2018-06-10 ENCOUNTER — Ambulatory Visit
Admission: RE | Admit: 2018-06-10 | Discharge: 2018-06-10 | Disposition: A | Discharge status: Home / Self Care | Payer: No Typology Code available for payment source | Source: Ambulatory Visit | Attending: Radiation Oncology | Admitting: Radiation Oncology

## 2018-06-10 DIAGNOSIS — C61 Malignant neoplasm of prostate: Secondary | ICD-10-CM | POA: Diagnosis not present

## 2018-06-11 ENCOUNTER — Other Ambulatory Visit: Payer: Self-pay

## 2018-06-11 ENCOUNTER — Ambulatory Visit
Admission: RE | Admit: 2018-06-11 | Discharge: 2018-06-11 | Disposition: A | Discharge status: Home / Self Care | Payer: No Typology Code available for payment source | Source: Ambulatory Visit | Attending: Radiation Oncology | Admitting: Radiation Oncology

## 2018-06-11 DIAGNOSIS — C61 Malignant neoplasm of prostate: Secondary | ICD-10-CM | POA: Diagnosis not present

## 2018-06-12 ENCOUNTER — Other Ambulatory Visit: Payer: Self-pay

## 2018-06-12 ENCOUNTER — Ambulatory Visit
Admission: RE | Admit: 2018-06-12 | Discharge: 2018-06-12 | Disposition: A | Discharge status: Home / Self Care | Payer: No Typology Code available for payment source | Source: Ambulatory Visit | Attending: Radiation Oncology | Admitting: Radiation Oncology

## 2018-06-12 DIAGNOSIS — C61 Malignant neoplasm of prostate: Secondary | ICD-10-CM | POA: Diagnosis not present

## 2018-06-13 ENCOUNTER — Other Ambulatory Visit: Payer: Self-pay

## 2018-06-13 ENCOUNTER — Ambulatory Visit
Admission: RE | Admit: 2018-06-13 | Discharge: 2018-06-13 | Disposition: A | Discharge status: Home / Self Care | Payer: No Typology Code available for payment source | Source: Ambulatory Visit | Attending: Radiation Oncology | Admitting: Radiation Oncology

## 2018-06-13 DIAGNOSIS — C61 Malignant neoplasm of prostate: Secondary | ICD-10-CM | POA: Diagnosis not present

## 2018-06-14 ENCOUNTER — Ambulatory Visit
Admission: RE | Admit: 2018-06-14 | Discharge: 2018-06-14 | Disposition: A | Discharge status: Home / Self Care | Payer: No Typology Code available for payment source | Source: Ambulatory Visit | Attending: Radiation Oncology | Admitting: Radiation Oncology

## 2018-06-14 ENCOUNTER — Ambulatory Visit: Payer: No Typology Code available for payment source

## 2018-06-14 ENCOUNTER — Other Ambulatory Visit: Payer: Self-pay

## 2018-06-14 DIAGNOSIS — C61 Malignant neoplasm of prostate: Secondary | ICD-10-CM | POA: Diagnosis not present

## 2018-06-17 ENCOUNTER — Other Ambulatory Visit: Payer: Self-pay

## 2018-06-17 ENCOUNTER — Ambulatory Visit
Admission: RE | Admit: 2018-06-17 | Discharge: 2018-06-17 | Disposition: A | Discharge status: Home / Self Care | Payer: No Typology Code available for payment source | Source: Ambulatory Visit | Attending: Radiation Oncology | Admitting: Radiation Oncology

## 2018-06-17 ENCOUNTER — Encounter: Payer: Self-pay | Admitting: Radiation Oncology

## 2018-06-17 DIAGNOSIS — C61 Malignant neoplasm of prostate: Secondary | ICD-10-CM | POA: Diagnosis not present

## 2018-07-02 NOTE — Progress Notes (Signed)
°  Radiation Oncology         [phone] ________________________________  Name: [name] MRN: [id number]  Date: 06/17/2018  DOB: 1945-04-05  End of Treatment Note  Diagnosis:   73 y.o. 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 The prostate, seminal vesicles, and pelvic lymph nodes were initially treated to 45 Gy in 25 fractions of 1.8 Gy  2. The prostate only was boosted to 75 Gy with 15 additional fractions of 2.0 Gy   Beams/energy:  1. The prostate, seminal vesicles, and pelvic lymph nodes were initially treated using VMAT intensity modulated radiotherapy delivering 6 megavolt photons. Image guidance was performed with CB-CT studies prior to each fraction. He was immobilized with a body fix lower extremity mold.  2. The prostate only was boosted using VMAT intensity modulated radiotherapy delivering 6 megavolt photons. Image guidance was performed with CB-CT studies prior to each fraction. He was immobilized with a body fix lower extremity mold.  Narrative: The patient tolerated radiation treatment relatively well.   The patient experienced modest fatigue and mild urinary irritation with urgency, weak stream, incomplete emptying, nocturia x5, and occasional dysuria. He denied gross hematuria. His bowel movements alternated between constipation and diarrhea but remained manageable.  Plan: The patient has completed radiation treatment. He will return to radiation oncology clinic for routine followup in one month, possibly conducted by WebEx or telephone. I advised him to call or return sooner if he has any questions or concerns related to his recovery or treatment. ________________________________  Sheral Apley. Tammi Klippel, M.D.  This document serves as a record of services personally  performed by Tyler Pita, MD. It was created on his behalf by Rae Lips, a trained medical scribe. The creation of this record is based on the scribe's personal observations and the provider's statements to them. This document has been checked and approved by the attending provider.

## 2018-08-21 DIAGNOSIS — R002 Palpitations: Secondary | ICD-10-CM | POA: Diagnosis not present

## 2018-08-21 DIAGNOSIS — I10 Essential (primary) hypertension: Secondary | ICD-10-CM | POA: Diagnosis not present

## 2018-08-21 DIAGNOSIS — E785 Hyperlipidemia, unspecified: Secondary | ICD-10-CM | POA: Diagnosis not present

## 2018-12-25 DIAGNOSIS — R002 Palpitations: Secondary | ICD-10-CM | POA: Diagnosis not present

## 2018-12-25 DIAGNOSIS — I10 Essential (primary) hypertension: Secondary | ICD-10-CM | POA: Diagnosis not present

## 2019-03-12 ENCOUNTER — Other Ambulatory Visit: Payer: Self-pay

## 2019-03-12 DIAGNOSIS — Z20822 Contact with and (suspected) exposure to covid-19: Secondary | ICD-10-CM

## 2019-03-13 LAB — NOVEL CORONAVIRUS, NAA: SARS-CoV-2, NAA: NOT DETECTED

## 2019-04-30 DIAGNOSIS — E785 Hyperlipidemia, unspecified: Secondary | ICD-10-CM | POA: Diagnosis not present

## 2019-04-30 DIAGNOSIS — I10 Essential (primary) hypertension: Secondary | ICD-10-CM | POA: Diagnosis not present

## 2019-04-30 DIAGNOSIS — R008 Other abnormalities of heart beat: Secondary | ICD-10-CM | POA: Diagnosis not present

## 2019-06-16 ENCOUNTER — Emergency Department (HOSPITAL_COMMUNITY)
Admission: EM | Admit: 2019-06-16 | Discharge: 2019-06-17 | Disposition: A | Discharge status: Home / Self Care | Payer: No Typology Code available for payment source | Attending: Emergency Medicine | Admitting: Emergency Medicine

## 2019-06-16 ENCOUNTER — Other Ambulatory Visit: Payer: Self-pay

## 2019-06-16 ENCOUNTER — Encounter (HOSPITAL_COMMUNITY): Payer: Self-pay | Admitting: Emergency Medicine

## 2019-06-16 DIAGNOSIS — R109 Unspecified abdominal pain: Secondary | ICD-10-CM | POA: Diagnosis not present

## 2019-06-16 DIAGNOSIS — Z7982 Long term (current) use of aspirin: Secondary | ICD-10-CM | POA: Diagnosis not present

## 2019-06-16 DIAGNOSIS — Z87891 Personal history of nicotine dependence: Secondary | ICD-10-CM | POA: Diagnosis not present

## 2019-06-16 DIAGNOSIS — K625 Hemorrhage of anus and rectum: Secondary | ICD-10-CM | POA: Diagnosis not present

## 2019-06-16 DIAGNOSIS — Z79899 Other long term (current) drug therapy: Secondary | ICD-10-CM | POA: Insufficient documentation

## 2019-06-16 DIAGNOSIS — Z8546 Personal history of malignant neoplasm of prostate: Secondary | ICD-10-CM | POA: Diagnosis not present

## 2019-06-16 DIAGNOSIS — R1110 Vomiting, unspecified: Secondary | ICD-10-CM | POA: Insufficient documentation

## 2019-06-16 DIAGNOSIS — I10 Essential (primary) hypertension: Secondary | ICD-10-CM | POA: Diagnosis not present

## 2019-06-16 LAB — CBC
HCT: 37.6 % — ABNORMAL LOW (ref 39.0–52.0)
Hemoglobin: 13.1 g/dL (ref 13.0–17.0)
MCH: 34.5 pg — ABNORMAL HIGH (ref 26.0–34.0)
MCHC: 34.8 g/dL (ref 30.0–36.0)
MCV: 98.9 fL (ref 80.0–100.0)
Platelets: 143 10*3/uL — ABNORMAL LOW (ref 150–400)
RBC: 3.8 MIL/uL — ABNORMAL LOW (ref 4.22–5.81)
RDW: 13.6 % (ref 11.5–15.5)
WBC: 4 10*3/uL (ref 4.0–10.5)
nRBC: 0 % (ref 0.0–0.2)

## 2019-06-16 LAB — ABO/RH: ABO/RH(D): O NEG

## 2019-06-16 LAB — COMPREHENSIVE METABOLIC PANEL
ALT: 54 U/L — ABNORMAL HIGH (ref 0–44)
AST: 45 U/L — ABNORMAL HIGH (ref 15–41)
Albumin: 3.9 g/dL (ref 3.5–5.0)
Alkaline Phosphatase: 66 U/L (ref 38–126)
Anion gap: 8 (ref 5–15)
BUN: 15 mg/dL (ref 8–23)
CO2: 27 mmol/L (ref 22–32)
Calcium: 9 mg/dL (ref 8.9–10.3)
Chloride: 106 mmol/L (ref 98–111)
Creatinine, Ser: 0.75 mg/dL (ref 0.61–1.24)
GFR calc Af Amer: >60 mL/min (ref >60)
GFR calc non Af Amer: >60 mL/min (ref >60)
Glucose, Bld: 106 mg/dL — ABNORMAL HIGH (ref 70–99)
Potassium: 3.9 mmol/L (ref 3.5–5.1)
Sodium: 141 mmol/L (ref 135–145)
Total Bilirubin: 1 mg/dL (ref 0.3–1.2)
Total Protein: 6.5 g/dL (ref 6.5–8.1)

## 2019-06-16 LAB — POC OCCULT BLOOD, ED: Fecal Occult Bld: POSITIVE — AB

## 2019-06-16 LAB — LIPASE, BLOOD: Lipase: 20 U/L (ref 11–51)

## 2019-06-16 LAB — TYPE AND SCREEN
ABO/RH(D): O NEG
Antibody Screen: NEGATIVE

## 2019-06-16 NOTE — ED Triage Notes (Signed)
Pt reports for over a month had rectal bleeding. Reports that has abd pains everyday and in the mornings wakes up vomiting acids. Reports had some red in the vomiting this morning. Reports when he told the [place] about the rectal bleeding, they didn't seem concerned and stated that patient was going to have a colonoscopy later this year. Stopped taking a baby ASA week ago.

## 2019-06-16 NOTE — ED Provider Notes (Signed)
[facility] DEPT Provider Note   CSN: [id number] Arrival date & time: 06/16/19  1442     History Chief Complaint  Patient presents with  . Rectal Bleeding  . Abdominal Pain  . Emesis    [name] is a 74 y.o. male.  The history is provided by the patient.  Rectal Bleeding Quality:  Bright red Amount:  Moderate Duration:  1 month Timing:  Intermittent Chronicity:  New Relieved by:  None tried Worsened by:  Nothing Associated symptoms: abdominal pain and vomiting   Associated symptoms: no epistaxis, no fever and no light-headedness   Risk factors: no anticoagulant use and no NSAID use   Abdominal Pain Associated symptoms: hematochezia and vomiting   Associated symptoms: no fever   Emesis Associated symptoms: abdominal pain   Associated symptoms: no fever   Patient with previous history of prostate cancer, hypertension, hyperlipidemia presents with rectal bleeding.  He reports for the past month every time he has a bowel movement he has bright red blood and sometimes dark stool.  This happens about every day.  He reports that earlier in the morning, he vomited once with "stomach acid "and pink material.  No other episodes of vomiting.  He reports he wakes up every morning with abdominal discomfort has been taking Prilosec every day He does not take anticoagulants.  He stopped taking aspirin over a week ago.  He is supposed to have a colonoscopy this year with the [facility] No history of liver disease.  No history of alcohol abuse    Past Medical History:  Diagnosis Date  . Arrhythmia    palpitations  . Depression   . GERD (gastroesophageal reflux disease)   . Hyperlipidemia   . Hypertension   . Prostate cancer ([place])   . Ulcer     Patient Active Problem List   Diagnosis Date Noted  . Malignant neoplasm of prostate ([place]) 02/19/2018  . Dysphagia 03/18/2012  . ED (erectile dysfunction) 02/21/2012  . Depression 02/21/2012  . PTSD  (post-traumatic stress disorder) 02/21/2012  . 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 Preventative health care 09/22/2010  . Hyperlipidemia 09/22/2010  . HTN (hypertension) 09/22/2010  . GERD (gastroesophageal reflux disease) 09/22/2010    Past Surgical History:  Procedure Laterality Date  . EUS N/A 03/21/2013   Procedure: UPPER ENDOSCOPIC ULTRASOUND (EUS) LINEAR;  Surgeon: Beryle Beams, MD;  Location: [facility] ENDOSCOPY;  Service: Endoscopy;  Laterality: N/A;  . HERNIA REPAIR  2001  . LAPAROSCOPIC GASTROTOMY W/ REPAIR OF ULCER  1997  . PROSTATE BIOPSY    . VASECTOMY         Family History  Problem Relation Age of Onset  . Arthritis Mother   . Hypertension Mother   . Cancer Mother        GYN type  . Heart disease Father        CHF  . Hypertension Father   . Diverticulitis Father   . Skin cancer Father   . Hypertension Brother   . Diabetes Maternal Grandmother   . Colon cancer Paternal Grandmother   . Cancer Paternal Grandmother   . Hyperlipidemia Brother   . Hyperlipidemia Brother   . Hypertension Brother   . Heart disease Brother 73       quadruple bypass, MI  . Diverticulitis Brother   . Heart disease Maternal Grandfather   . Heart disease Paternal Grandfather   . Hyperlipidemia Sister   . Breast cancer Neg Hx   . Prostate  cancer Neg Hx     Social History   Tobacco Use  . Smoking status: Former Smoker    Packs/day: 0.25    Years: 2.00    Pack years: 0.50    Types: Cigarettes    Quit date: 09/22/1970    Years since quitting: 48.7  . Smokeless tobacco: Never Used  . Tobacco comment: socially   Substance Use Topics  . Alcohol use: No  . Drug use: No    Home Medications Prior to Admission medications   Medication Sig Start Date End Date Taking? Authorizing Provider  acetaminophen (TYLENOL) 500 MG tablet Take 500 mg by mouth daily.    [provider]  aspirin 81 MG tablet Take 81 mg by mouth daily.     [provider]  atorvastatin (LIPITOR) 80 MG tablet  Take 80 mg by mouth daily. TAKE ONE-HALF TABLET BY MOUTH AT BEDTIME FOR CHOLESTEROL.    [provider]  Cholecalciferol (VITAMIN D) 50 MCG (2000 UT) tablet Take by mouth. 01/14/13   [provider]  cyclobenzaprine (FLEXERIL) 5 MG tablet Take 1 tablet (5 mg total) by mouth at bedtime. 04/17/17   Saguier, Percell Miller, PA-C  fish oil-omega-3 fatty acids 1000 MG capsule Take 1 g by mouth daily.     [provider]  hydrocortisone 2.5 % cream APPLY SMALL AMOUNT TO AFFECTED AREA TWICE A DAY AS NEEDED USE MIXED WITH LAMISIL CREAM TWICE A DAY ON AFFECTED AREA. 01/13/14   [provider]  metoprolol (LOPRESSOR) 50 MG tablet Take 50 mg by mouth 2 (two) times daily.     [provider]  Multiple Vitamins-Minerals (ICAPS PO) Take by mouth.    [provider]  NITROSTAT 0.4 MG SL tablet Place 0.4 mg under the tongue every 5 (five) minutes as needed for chest pain.  07/27/10   [provider]  polyethylene glycol (MIRALAX / GLYCOLAX) packet Take by mouth.    [provider]  PSYLLIUM HUSK PO TAKE 1 TEASPOONFUL BY  MOUTH DAILY.  (MIX IN 8 OUNCES OF WATER OR JUICE AND DRINK). 06/09/14   [provider]  sildenafil (VIAGRA) 100 MG tablet Take 100 mg by mouth daily as needed for erectile dysfunction. TAKE ONE-HALF TABLET BY MOUTH AS INSTRUCTED. (TAKE 1 HOUR PRIOR TO SEXUAL ACTIVITY *DO NOT EXCEED 1 DOSE PER 24 HOUR PERIOD*)    [provider]  terazosin (HYTRIN) 1 MG capsule Take by mouth.    [provider]  terbinafine (LAMISIL) 1 % cream APPLY SMALL AMOUNT TO AFFECTED AREA TWICE A DAY TO GROIN. 01/13/14   [provider]    Allergies    Codeine and Penicillins  Review of Systems   Review of Systems  Constitutional: Negative for fever.  HENT: Negative for nosebleeds.   Gastrointestinal: Positive for abdominal pain, blood in stool, hematochezia and vomiting.  Neurological: Negative for light-headedness.  All  other systems reviewed and are negative.   Physical Exam Updated Vital Signs BP (!) 151/68   Pulse 60   Temp 98.1 F (36.7 C) (Oral)   Resp 19   SpO2 100%   Physical Exam CONSTITUTIONAL: Well developed/well nourished HEAD: Normocephalic/atraumatic EYES: EOMI/PERRL, conjunctival pink ENMT: Mucous membranes moist NECK: supple no meningeal signs SPINE/BACK:entire spine nontender CV: S1/S2 noted, no murmurs/rubs/gallops noted LUNGS: Lungs are clear to auscultation bilaterally, no apparent distress ABDOMEN: soft, nontender, no rebound or guarding, bowel sounds noted throughout abdomen Rectal-stool is brown, no blood or melena, chaperone present GU:no cva  tenderness NEURO: Pt is awake/alert/appropriate, moves all extremitiesx4.  No facial droop.  EXTREMITIES: pulses normal/equal, full ROM SKIN: warm, color normal PSYCH: no abnormalities of mood noted, alert and oriented to situation  ED Results / Procedures / Treatments   Labs (all labs ordered are listed, but only abnormal results are displayed) Labs Reviewed  COMPREHENSIVE METABOLIC PANEL - Abnormal; Notable for the following components:      Result Value   Glucose, Bld 106 (*)    AST 45 (*)    ALT 54 (*)    All other components within normal limits  CBC - Abnormal; Notable for the following components:   RBC 3.80 (*)    HCT 37.6 (*)    MCH 34.5 (*)    Platelets 143 (*)    All other components within normal limits  POC OCCULT BLOOD, ED - Abnormal; Notable for the following components:   Fecal Occult Bld POSITIVE (*)    All other components within normal limits  LIPASE, BLOOD  POC OCCULT BLOOD, ED  TYPE AND SCREEN  ABO/RH    EKG None  Radiology No results found.  Procedures Procedures (including critical care time)  Medications Ordered in ED Medications - No data to display  ED Course  I have reviewed the triage vital signs and the nursing notes.  Pertinent labs results that were available during my  care of the patient were reviewed by me and considered in my medical decision making (see chart for details).    MDM Rules/Calculators/A&P                       This patient presents to the ED for concern of rectal bleeding, this involves an extensive number of treatment options, and is a complaint that carries with it a high risk of complications and morbidity.  The differential diagnosis includes diverticulosis, ulcer, variceal bleed   Lab Tests:   I Ordered, reviewed, and interpreted labs, which included complete blood count, metabolic panel    Additional history obtained:     Previous records obtained and reviewed records from [place] hospital reviewed, CBC from last month shows similar hemoglobin to today's finding.  Patient brought labs with him   Reevaluation:  After the interventions stated above, I reevaluated the patient and found stable  Patient reports rectal bleeding for up to a month.  Hemoglobin stable.  Hemoccult positive but no melena or blood on exam likely from previous bleeding he is in no acute distress.  Vitals appropriate.  Could be internal hemorrhoids, but no signs of emergent GI bleed.  He will avoid aspirin products.  Continue Prilosec.  Needs close follow-up with the [facility] hospital gastroenterology. .   Final Clinical Impression(s) / ED Diagnoses Final diagnoses:  Rectal bleeding    Rx / DC Orders ED Discharge Orders    None       Ripley Fraise, MD 06/16/19 2356

## 2019-06-16 NOTE — Discharge Instructions (Addendum)
Please avoid aspirin and ibuprofen products.  Please call your [place] doctor this week to have a close follow-up and to schedule a colonoscopy and endoscopy If you start vomiting blood, feel weak dizzy or pass out please return to the ER

## 2019-06-17 NOTE — ED Notes (Signed)
Pt educated to avoid Ibuprofen and Asprin return to ED for dizziness large amount of bloody stool or vomiting blood.

## 2019-06-17 NOTE — ED Notes (Signed)
Pt ambulatory too restroom and back to room without concerns or assistance.

## 2019-07-25 DIAGNOSIS — Z961 Presence of intraocular lens: Secondary | ICD-10-CM | POA: Diagnosis not present

## 2019-07-25 DIAGNOSIS — H2511 Age-related nuclear cataract, right eye: Secondary | ICD-10-CM | POA: Diagnosis not present

## 2019-07-25 DIAGNOSIS — H353131 Nonexudative age-related macular degeneration, bilateral, early dry stage: Secondary | ICD-10-CM | POA: Diagnosis not present

## 2019-09-03 DIAGNOSIS — E785 Hyperlipidemia, unspecified: Secondary | ICD-10-CM | POA: Diagnosis not present

## 2019-09-03 DIAGNOSIS — Z8679 Personal history of other diseases of the circulatory system: Secondary | ICD-10-CM | POA: Diagnosis not present

## 2019-09-03 DIAGNOSIS — I10 Essential (primary) hypertension: Secondary | ICD-10-CM | POA: Diagnosis not present

## 2019-11-26 DIAGNOSIS — K219 Gastro-esophageal reflux disease without esophagitis: Secondary | ICD-10-CM | POA: Insufficient documentation

## 2019-11-26 DIAGNOSIS — K449 Diaphragmatic hernia without obstruction or gangrene: Secondary | ICD-10-CM | POA: Insufficient documentation
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
## 2019-12-24 DIAGNOSIS — K449 Diaphragmatic hernia without obstruction or gangrene: Secondary | ICD-10-CM | POA: Insufficient documentation

## 2020-01-17 DIAGNOSIS — E785 Hyperlipidemia, unspecified: Secondary | ICD-10-CM | POA: Diagnosis not present

## 2020-01-17 DIAGNOSIS — I10 Essential (primary) hypertension: Secondary | ICD-10-CM | POA: Diagnosis not present

## 2020-12-10 DIAGNOSIS — E785 Hyperlipidemia, unspecified: Secondary | ICD-10-CM | POA: Diagnosis not present

## 2020-12-10 DIAGNOSIS — I10 Essential (primary) hypertension: Secondary | ICD-10-CM | POA: Diagnosis not present

## 2021-03-03 DIAGNOSIS — R1310 Dysphagia, unspecified: Secondary | ICD-10-CM | POA: Diagnosis not present

## 2021-03-03 DIAGNOSIS — Z9889 Other specified postprocedural states: Secondary | ICD-10-CM | POA: Diagnosis not present

## 2021-03-03 DIAGNOSIS — Z8719 Personal history of other diseases of the digestive system: Secondary | ICD-10-CM | POA: Diagnosis not present

## 2021-04-28 ENCOUNTER — Ambulatory Visit (INDEPENDENT_AMBULATORY_CARE_PROVIDER_SITE_OTHER): Payer: No Typology Code available for payment source

## 2021-04-28 ENCOUNTER — Other Ambulatory Visit: Payer: Self-pay

## 2021-04-28 ENCOUNTER — Ambulatory Visit
Admission: RE | Admit: 2021-04-28 | Discharge: 2021-04-28 | Disposition: A | Discharge status: Home / Self Care | Payer: No Typology Code available for payment source | Source: Ambulatory Visit | Attending: Physician Assistant | Admitting: Physician Assistant

## 2021-04-28 VITALS — BP 144/79 | HR 66 | Temp 98.4°F | Ht 71.0 in | Wt 200.0 lb

## 2021-04-28 DIAGNOSIS — W19XXXA Unspecified fall, initial encounter: Secondary | ICD-10-CM

## 2021-04-28 DIAGNOSIS — T148XXA Other injury of unspecified body region, initial encounter: Secondary | ICD-10-CM | POA: Diagnosis not present

## 2021-04-28 DIAGNOSIS — M545 Low back pain, unspecified: Secondary | ICD-10-CM

## 2021-04-28 NOTE — ED Triage Notes (Signed)
Patient states that he fell last night and hit the right lower side of his back on the corner of a desk.  No loss of consciousness.

## 2021-04-28 NOTE — ED Provider Notes (Signed)
[facility]    CSN: [id number] Arrival date & time: 04/28/21  1041      History   Chief Complaint Chief Complaint  Patient presents with   Appointment   Fall    HPI [name] is a 76 y.o. male.   Patient here today for evaluation of right lower back pain that started after he fell last night and hit his right lower back on the corner of the desk.  He did not hit his head.  There is no loss of consciousness.  He denies any numbness or tingling.  He has not had any hematuria.  He denies any nausea or vomiting.  The history is provided by the patient.   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 Authorizing Provider  acetaminophen (TYLENOL) 500 MG tablet Take 500 mg by mouth every 6 (six) hours as needed for mild pain.    Yes [provider]  aspirin 81 MG tablet Take 81 mg by mouth daily.    Yes [provider]  atorvastatin (LIPITOR) 80 MG  tablet Take 80 mg by mouth daily. TAKE ONE-HALF TABLET BY MOUTH AT BEDTIME FOR CHOLESTEROL.   Yes [provider]  Cholecalciferol (VITAMIN D) 50 MCG (2000 UT) tablet Take 2,000 Units by mouth daily.  01/14/13  Yes [provider]  metoprolol (LOPRESSOR) 50 MG tablet Take 50 mg by mouth 2 (two) times daily.    Yes [provider]  Multiple Vitamins-Minerals (ICAPS PO) Take 1 tablet by mouth daily.    Yes [provider]  NITROSTAT 0.4 MG SL tablet Place 0.4 mg under the tongue every 5 (five) minutes as needed for chest pain.  07/27/10  Yes [provider]  omeprazole (PRILOSEC) 20 MG capsule Take 20 mg by mouth daily.   Yes [provider]  polyethylene glycol (MIRALAX / GLYCOLAX) packet Take 17 g by mouth daily as needed for mild constipation.    Yes [provider]  terazosin (HYTRIN) 5 MG capsule Take 5 mg by mouth at bedtime.   Yes [provider]  venlafaxine (EFFEXOR) 75 MG tablet Take 75 mg by mouth daily.   Yes [provider]  vitamin B-12 (CYANOCOBALAMIN) 500 MCG tablet Take 500 mcg by mouth daily.   Yes [provider]  cyclobenzaprine (FLEXERIL) 5 MG tablet Take 1 tablet (5 mg total)  by mouth at bedtime. Patient not taking: Reported on 06/16/2019 04/17/17   Saguier, Percell Miller, PA-C    Family History Family History  Problem Relation Age of Onset   Arthritis Mother    Hypertension Mother    Cancer Mother        GYN type   Heart disease Father        CHF   Hypertension Father    Diverticulitis Father    Skin cancer Father    Hypertension Brother    Diabetes Maternal Grandmother    Colon cancer Paternal Grandmother    Cancer Paternal Grandmother    Hyperlipidemia Brother    Hyperlipidemia Brother    Hypertension Brother    Heart disease Brother 60       quadruple bypass, MI   Diverticulitis Brother    Heart disease Maternal Grandfather    Heart disease Paternal Grandfather     Hyperlipidemia Sister    Breast cancer Neg Hx    Prostate cancer Neg Hx     Social History Social History   Tobacco Use   Smoking status: Former    Packs/day: 0.25    Years: 2.00    Pack years: 0.50    Types: Cigarettes    Quit date: 09/22/1970    Years since quitting: 50.6   Smokeless tobacco: Never   Tobacco comments:    socially   Vaping Use   Vaping Use: Never used  Substance Use Topics   Alcohol use: No   Drug use: No     Allergies   Codeine and Penicillins   Review of Systems Review of Systems  Constitutional:  Negative for chills and fever.  Eyes:  Negative for discharge and redness.  Musculoskeletal:  Positive for back pain and myalgias.  Skin:  Positive for wound.  Neurological:  Negative for numbness.    Physical Exam Triage Vital Signs ED Triage Vitals  Enc Vitals Group     BP      Pulse      Resp      Temp      Temp src      SpO2      Weight      Height      Head Circumference      Peak Flow      Pain Score      Pain Loc      Pain Edu?      Excl. in [place]?    No data found.  Updated Vital Signs BP (!) 144/79 (BP Location: Left Arm)    Pulse 66    Temp 98.4 F (36.9 C) (Oral)    Ht 5\' 11"  (1.803 m)    Wt 200 lb (90.7 kg)    SpO2 94%    BMI 27.89 kg/m      Physical Exam Vitals and nursing note reviewed.  Constitutional:      General: He is not in acute distress.    Appearance: Normal appearance. He is not ill-appearing.  HENT:     Head: Normocephalic and atraumatic.  Eyes:     Conjunctiva/sclera: Conjunctivae normal.  Cardiovascular:     Rate and Rhythm: Normal rate.  Pulmonary:     Effort: Pulmonary effort is normal.  Skin:    Comments: Approximately 3 cm superficial abrasion noted to right lower back with minimal bleeding, mild surrounding erythema, bruising  Neurological:     Mental Status: He is alert.  Psychiatric:  Mood and Affect: Mood normal.        Behavior: Behavior normal.        Thought Content: Thought  content normal.     UC Treatments / Results  Labs (all labs ordered are listed, but only abnormal results are displayed) Labs Reviewed - No data to display  EKG   Radiology DG Lumbar Spine Complete  Result Date: 04/28/2021 CLINICAL DATA:  Fall EXAM: LUMBAR SPINE - COMPLETE 4+ VIEW COMPARISON:  2019 FINDINGS: Similar vertebral body heights and alignment. Multilevel disc space narrowing with endplate osteophytes. Multilevel facet hypertrophy. IMPRESSION: No acute abnormality.  Multilevel spondylosis. Electronically Signed   By: Macy Mis M.D.   On: 04/28/2021 11:44    Procedures Procedures (including critical care time)  Medications Ordered in UC Medications - No data to display  Initial Impression / Assessment and Plan / UC Course  I have reviewed the triage vital signs and the nursing notes.  Pertinent labs & imaging results that were available during my care of the patient were reviewed by me and considered in my medical decision making (see chart for details).    Xray without fracture. Recommended keeping wound covered. Encouraged follow up if no gradual improvement or if symptoms worsen in any way.   Final Clinical Impressions(s) / UC Diagnoses   Final diagnoses:  Fall, initial encounter  Acute right-sided low back pain without sciatica  Abrasion   Discharge Instructions   None    ED Prescriptions   None    PDMP not reviewed this encounter.   Francene Finders, PA-C 04/28/21 1215

## 2021-05-01 ENCOUNTER — Ambulatory Visit
Admission: EM | Admit: 2021-05-01 | Discharge: 2021-05-01 | Disposition: A | Discharge status: Other Acute Inpatient Hospital | Payer: No Typology Code available for payment source

## 2021-05-01 ENCOUNTER — Emergency Department (HOSPITAL_BASED_OUTPATIENT_CLINIC_OR_DEPARTMENT_OTHER): Payer: No Typology Code available for payment source

## 2021-05-01 ENCOUNTER — Emergency Department (HOSPITAL_BASED_OUTPATIENT_CLINIC_OR_DEPARTMENT_OTHER): Payer: No Typology Code available for payment source | Admitting: Radiology

## 2021-05-01 ENCOUNTER — Other Ambulatory Visit: Payer: Self-pay

## 2021-05-01 ENCOUNTER — Emergency Department (HOSPITAL_BASED_OUTPATIENT_CLINIC_OR_DEPARTMENT_OTHER)
Admission: EM | Admit: 2021-05-01 | Discharge: 2021-05-01 | Disposition: A | Discharge status: Home / Self Care | Payer: No Typology Code available for payment source | Attending: Emergency Medicine | Admitting: Emergency Medicine

## 2021-05-01 ENCOUNTER — Encounter: Payer: Self-pay | Admitting: Emergency Medicine

## 2021-05-01 ENCOUNTER — Encounter (HOSPITAL_BASED_OUTPATIENT_CLINIC_OR_DEPARTMENT_OTHER): Payer: Self-pay | Admitting: Emergency Medicine

## 2021-05-01 DIAGNOSIS — S301XXA Contusion of abdominal wall, initial encounter: Secondary | ICD-10-CM | POA: Insufficient documentation

## 2021-05-01 DIAGNOSIS — S300XXA Contusion of lower back and pelvis, initial encounter: Secondary | ICD-10-CM | POA: Insufficient documentation

## 2021-05-01 DIAGNOSIS — Z7982 Long term (current) use of aspirin: Secondary | ICD-10-CM | POA: Insufficient documentation

## 2021-05-01 DIAGNOSIS — W19XXXA Unspecified fall, initial encounter: Secondary | ICD-10-CM | POA: Diagnosis not present

## 2021-05-01 DIAGNOSIS — I10 Essential (primary) hypertension: Secondary | ICD-10-CM | POA: Diagnosis not present

## 2021-05-01 DIAGNOSIS — R1031 Right lower quadrant pain: Secondary | ICD-10-CM | POA: Diagnosis not present

## 2021-05-01 DIAGNOSIS — W01198A Fall on same level from slipping, tripping and stumbling with subsequent striking against other object, initial encounter: Secondary | ICD-10-CM | POA: Diagnosis not present

## 2021-05-01 DIAGNOSIS — S2231XA Fracture of one rib, right side, initial encounter for closed fracture: Secondary | ICD-10-CM | POA: Diagnosis not present

## 2021-05-01 DIAGNOSIS — Z8546 Personal history of malignant neoplasm of prostate: Secondary | ICD-10-CM | POA: Diagnosis not present

## 2021-05-01 DIAGNOSIS — Z79899 Other long term (current) drug therapy: Secondary | ICD-10-CM | POA: Insufficient documentation

## 2021-05-01 DIAGNOSIS — S299XXA Unspecified injury of thorax, initial encounter: Secondary | ICD-10-CM | POA: Diagnosis present

## 2021-05-01 LAB — COMPREHENSIVE METABOLIC PANEL
ALT: 19 U/L (ref 0–44)
AST: 22 U/L (ref 15–41)
Albumin: 3.9 g/dL (ref 3.5–5.0)
Alkaline Phosphatase: 69 U/L (ref 38–126)
Anion gap: 8 (ref 5–15)
BUN: 13 mg/dL (ref 8–23)
CO2: 24 mmol/L (ref 22–32)
Calcium: 9.1 mg/dL (ref 8.9–10.3)
Chloride: 107 mmol/L (ref 98–111)
Creatinine, Ser: 0.73 mg/dL (ref 0.61–1.24)
GFR, Estimated: >60 mL/min (ref >60)
Glucose, Bld: 113 mg/dL — ABNORMAL HIGH (ref 70–99)
Potassium: 4.1 mmol/L (ref 3.5–5.1)
Sodium: 139 mmol/L (ref 135–145)
Total Bilirubin: 0.7 mg/dL (ref 0.3–1.2)
Total Protein: 6.6 g/dL (ref 6.5–8.1)

## 2021-05-01 LAB — URINALYSIS, ROUTINE W REFLEX MICROSCOPIC
Bilirubin Urine: NEGATIVE
Glucose, UA: NEGATIVE mg/dL
Hgb urine dipstick: NEGATIVE
Ketones, ur: NEGATIVE mg/dL
Leukocytes,Ua: NEGATIVE
Nitrite: NEGATIVE
Specific Gravity, Urine: 1.042 — ABNORMAL HIGH (ref 1.005–1.030)
pH: 5.5 (ref 5.0–8.0)

## 2021-05-01 LAB — PROTIME-INR
INR: 1 (ref 0.8–1.2)
Prothrombin Time: 13.6 seconds (ref 11.4–15.2)

## 2021-05-01 LAB — CBC
HCT: 40.3 % (ref 39.0–52.0)
Hemoglobin: 13.7 g/dL (ref 13.0–17.0)
MCH: 32.3 pg (ref 26.0–34.0)
MCHC: 34 g/dL (ref 30.0–36.0)
MCV: 95 fL (ref 80.0–100.0)
Platelets: 160 10*3/uL (ref 150–400)
RBC: 4.24 MIL/uL (ref 4.22–5.81)
RDW: 14.1 % (ref 11.5–15.5)
WBC: 5.7 10*3/uL (ref 4.0–10.5)
nRBC: 0 % (ref 0.0–0.2)

## 2021-05-01 LAB — LIPASE, BLOOD: Lipase: 12 U/L (ref 11–51)

## 2021-05-01 MED ORDER — ONDANSETRON HCL 4 MG/2ML IJ SOLN
4.0000 mg | Freq: Once | INTRAMUSCULAR | Status: AC
  Administered 2021-05-01: 4 mg via INTRAVENOUS
  Filled 2021-05-01: qty 2

## 2021-05-01 MED ORDER — OXYCODONE-ACETAMINOPHEN 5-325 MG PO TABS
1.0000 | ORAL_TABLET | Freq: Once | ORAL | Status: AC
  Administered 2021-05-01: 1 via ORAL
  Filled 2021-05-01: qty 1

## 2021-05-01 MED ORDER — SODIUM CHLORIDE 0.9 % IV BOLUS
500.0000 mL | Freq: Once | INTRAVENOUS | Status: AC
  Administered 2021-05-01: 500 mL via INTRAVENOUS

## 2021-05-01 MED ORDER — OXYCODONE-ACETAMINOPHEN 5-325 MG PO TABS: 1.0000 | ORAL_TABLET | Freq: Four times a day (QID) | ORAL | 0 refills | Status: AC | PRN

## 2021-05-01 MED ORDER — FENTANYL CITRATE PF 50 MCG/ML IJ SOSY
50.0000 ug | PREFILLED_SYRINGE | Freq: Once | INTRAMUSCULAR | Status: AC
  Administered 2021-05-01: 50 ug via INTRAVENOUS
  Filled 2021-05-01: qty 1

## 2021-05-01 MED ORDER — LIDOCAINE 5 % EX PTCH: 1.0000 | MEDICATED_PATCH | CUTANEOUS | 0 refills | Status: AC

## 2021-05-01 MED ORDER — IOHEXOL 300 MG/ML  SOLN
100.0000 mL | Freq: Once | INTRAMUSCULAR | Status: AC | PRN
  Administered 2021-05-01: 100 mL via INTRAVENOUS

## 2021-05-01 NOTE — ED Notes (Signed)
He tells me that he "fell backward" at home on Wed. (4 days ago) and is currently having right flank area discomfort.

## 2021-05-01 NOTE — ED Provider Notes (Signed)
[facility] EMERGENCY DEPT Provider Note   CSN: [id number] Arrival date & time: 05/01/21  1523     History  No chief complaint on file.   [name] is a 76 y.o. male.  The history is provided by the patient and medical records. No language interpreter was used.  Fall This is a new problem. The current episode started more than 2 days ago. The problem occurs rarely. Pertinent negatives include no chest pain, no abdominal pain, no headaches and no shortness of breath. Associated symptoms comments: R back/flank pain/bruising. Nothing aggravates the symptoms. Nothing relieves the symptoms. He has tried nothing for the symptoms. The treatment provided no relief.      Home Medications Prior to Admission medications   Medication Sig Start Date End Date Taking? Authorizing Provider  acetaminophen (TYLENOL) 500 MG tablet Take 500 mg by mouth every 6 (six) hours as needed for mild pain.     [provider]  aspirin 81 MG tablet Take 81 mg by mouth daily.     [provider]  atorvastatin (LIPITOR) 80 MG tablet Take 80 mg by mouth daily. TAKE ONE-HALF TABLET BY MOUTH AT BEDTIME FOR CHOLESTEROL.    [provider]  Cholecalciferol (VITAMIN D) 50 MCG (2000 UT) tablet Take 2,000 Units by mouth daily.  01/14/13   [provider]  cyclobenzaprine (FLEXERIL) 5 MG tablet Take 1 tablet (5 mg total) by mouth at bedtime. 04/17/17   Saguier, Percell Miller, PA-C  metoprolol (LOPRESSOR) 50 MG tablet Take 50 mg by mouth 2 (two) times daily.     [provider]  Multiple Vitamins-Minerals (ICAPS PO) Take 1 tablet by mouth daily.     [provider]  NITROSTAT 0.4 MG SL tablet Place 0.4 mg under the tongue every 5 (five) minutes as needed for chest pain.  07/27/10   [provider]  omeprazole (PRILOSEC) 20 MG capsule Take 20 mg by mouth daily.    [provider]  polyethylene glycol (MIRALAX / GLYCOLAX) packet Take 17 g by mouth  daily as needed for mild constipation.     [provider]  terazosin (HYTRIN) 5 MG capsule Take 5 mg by mouth at bedtime.    [provider]  venlafaxine (EFFEXOR) 75 MG tablet Take 75 mg by mouth daily.    [provider]  vitamin B-12 (CYANOCOBALAMIN) 500 MCG tablet Take 500 mcg by mouth daily.    [provider]      Allergies    Codeine and Penicillins    Review of Systems   Review of Systems  Constitutional:  Negative for chills, diaphoresis and fever.  HENT:  Negative for congestion.   Respiratory:  Negative for cough, chest tightness, shortness of breath and wheezing.   Cardiovascular:  Negative for chest pain, palpitations and leg swelling.  Gastrointestinal:  Negative for abdominal pain.  Genitourinary:  Positive for flank pain. Negative for dysuria, frequency and hematuria.  Musculoskeletal:  Positive for back pain. Negative for neck pain and neck stiffness.  Skin:  Positive for wound (abrasion and bruising). Negative for rash.  Neurological:  Negative for weakness, light-headedness, numbness and headaches.  Psychiatric/Behavioral:  Negative for agitation.   All other systems reviewed and are negative.  Physical Exam Updated Vital Signs BP (!) 149/86    Pulse (!) 56    Temp 98.1 F (36.7 C)    Resp 16    SpO2 99%  Physical Exam Vitals and nursing note reviewed.  Constitutional:  General: He is not in acute distress.    Appearance: He is well-developed. He is not ill-appearing, toxic-appearing or diaphoretic.  HENT:     Head: Normocephalic and atraumatic.     Nose: No congestion or rhinorrhea.     Mouth/Throat:     Mouth: Mucous membranes are moist.     Pharynx: No oropharyngeal exudate or posterior oropharyngeal erythema.  Eyes:     Extraocular Movements: Extraocular movements intact.     Conjunctiva/sclera: Conjunctivae normal.     Pupils: Pupils are equal, round, and reactive to light.  Cardiovascular:     Rate and  Rhythm: Normal rate and regular rhythm.     Heart sounds: No murmur heard. Pulmonary:     Effort: Pulmonary effort is normal. No respiratory distress.     Breath sounds: Normal breath sounds. No wheezing, rhonchi or rales.  Chest:     Chest wall: No tenderness.  Abdominal:     General: Abdomen is flat.     Palpations: Abdomen is soft.     Tenderness: There is no abdominal tenderness. There is right CVA tenderness. There is no left CVA tenderness, guarding or rebound.  Musculoskeletal:        General: Tenderness and signs of injury present. No swelling.     Cervical back: Neck supple. No tenderness.     Lumbar back: Signs of trauma, spasms and tenderness present. No lacerations or bony tenderness.       Back:  Skin:    General: Skin is warm and dry.     Capillary Refill: Capillary refill takes less than 2 seconds.     Findings: Bruising present. No rash.  Neurological:     General: No focal deficit present.     Mental Status: He is alert.     Sensory: No sensory deficit.     Motor: No weakness.  Psychiatric:        Mood and Affect: Mood normal.    ED Results / Procedures / Treatments   Labs (all labs ordered are listed, but only abnormal results are displayed) Labs Reviewed  COMPREHENSIVE METABOLIC PANEL - Abnormal; Notable for the following components:      Result Value   Glucose, Bld 113 (*)    All other components within normal limits  URINALYSIS, ROUTINE W REFLEX MICROSCOPIC - Abnormal; Notable for the following components:   Specific Gravity, Urine 1.042 (*)    Protein, ur TRACE (*)    All other components within normal limits  URINE CULTURE  LIPASE, BLOOD  PROTIME-INR  CBC    EKG None  Radiology DG Ribs Unilateral W/Chest Right  Result Date: 05/01/2021 CLINICAL DATA:  Fall with right posterior back pain EXAM: RIGHT RIBS AND CHEST - 3+ VIEW COMPARISON:  None. FINDINGS: Possible nondisplaced fracture of the posterolateral right eleventh or tenth rib on the  oblique image with the BB. Elevation of the right hemidiaphragm. Lungs are clear. Normal heart size. IMPRESSION: Possible nondisplaced fracture of the posterolateral right eleventh or tenth rib. Elevation of the right hemidiaphragm. Electronically Signed   By: Macy Mis M.D.   On: 05/01/2021 19:40   CT ABDOMEN PELVIS W CONTRAST  Result Date: 05/01/2021 CLINICAL DATA:  Blunt trauma.  RIGHT flank pain and bruising.  Fall EXAM: CT ABDOMEN AND PELVIS WITH CONTRAST TECHNIQUE: Multidetector CT imaging of the abdomen and pelvis was performed using the standard protocol following bolus administration of intravenous contrast. RADIATION DOSE REDUCTION: This exam was performed according to the departmental  dose-optimization program which includes automated exposure control, adjustment of the mA and/or kV according to patient size and/or use of iterative reconstruction technique. CONTRAST:  17mL OMNIPAQUE IOHEXOL 300 MG/ML  SOLN COMPARISON:  None. FINDINGS: Lower chest: Lung bases are clear. Elevation of the RIGHT hemidiaphragm. Hepatobiliary: No focal hepatic lesion. No biliary duct dilatation. Common bile duct is normal. Pancreas: Pancreas is normal. No ductal dilatation. No pancreatic inflammation. Spleen: Normal spleen Adrenals/urinary tract: Adrenal glands and kidneys are normal. The ureters and bladder normal. Stomach/Bowel: Small hiatal hernia. Small amount fluid adjacent to the distal esophagus in RIGHT diaphragm (image 20/2. Vascular/Lymphatic: Abdominal aorta is normal caliber. No periportal or retroperitoneal adenopathy. No pelvic adenopathy. Reproductive: Unremarkable Other: No free fluid the abdomen pelvis. No retroperitoneal hematoma. Musculoskeletal: There is mild subcutaneous bruising over the RIGHT flank (image 41/2. No subcutaneous or muscular hematoma. There is a lucency in the posterior RIGHT eleventh rib (image 21/coronal series 5). Probably nondisplaced fracture. IMPRESSION: 1. No retroperitoneal  hematoma. Mild subcutaneous bruising in the RIGHT flank. 2. Potential nondisplaced fracture the posterior 11th  RIGHT rib. 3. Elevation of the RIGHT hemidiaphragm is age indeterminate. 4. Mild atelectasis in the RIGHT lower lobe.  No pneumothorax. 5. Small amount fluid along the distal esophagus of unclear etiology. Electronically Signed   By: Suzy Bouchard M.D.   On: 05/01/2021 19:38    Procedures Procedures    Medications Ordered in ED Medications  oxyCODONE-acetaminophen (PERCOCET/ROXICET) 5-325 MG per tablet 1 tablet (has no administration in time range)  fentaNYL (SUBLIMAZE) injection 50 mcg (50 mcg Intravenous Given 05/01/21 1758)  ondansetron (ZOFRAN) injection 4 mg (4 mg Intravenous Given 05/01/21 1757)  sodium chloride 0.9 % bolus 500 mL ( Intravenous Stopped 05/01/21 1906)  iohexol (OMNIPAQUE) 300 MG/ML solution 100 mL (100 mLs Intravenous Contrast Given 05/01/21 1915)    ED Course/ Medical Decision Making/ A&P                           Medical Decision Making Amount and/or Complexity of Data Reviewed Labs: ordered. Radiology: ordered.  Risk Prescription drug management.    [name] is a 76 y.o. male with a past medical history significant for hypertension, hyperlipidemia, previous prostate cancer with intermittent rectal bleeding chronically, GERD, previous hiatal hernia surgery, and previous gastric ulcers who presents with fall.  According to patient, 4 days ago he had a mechanical fall hitting his right flank/low back onto the corner of a small table.  He did not hit his head or lose consciousness.  He says that the pain was mild to moderate initially and he had an x-ray of his lumbar spine that did not show any fracture.  He said that it was doing better but over the last 24 hours as rapidly started to worsen.  He reports the pain is more severe especially with any bending or moving.  He reports some nausea with the pain but no vomiting.  He reports no constipation,  diarrhea, or urinary changes.  He reports the bruising started to worsen and with his severe pain he wanted to make sure there is no other injuries.  On my exam, lungs were clear and chest was nontender.  Right lateral abdomen/flank was tender to palpation rest of abdomen was nontender.  Bowel sounds were appreciated.  Right flank and CVA area has ecchymosis and bruising and an abrasion.  It is tender.  Midline was nontender.  Patient had intact sensation, strength, and  pulses distally.  No groin pain or symptoms reported.  Given his amount of discomfort and location of injury, I am somewhat concerned he could have had some worsened musculoskeletal pain but it also could be more serious retroperitoneal hemorrhage, bleeding, hematoma, or even solid organ or intraperitoneal injury.  We will get CT abdomen pelvis and a chest x-ray with a right rib focus and give him some pain medicine, nausea medicine, fluids as he says he is not eating or drinking for the last few hours, and some labs.  Anticipate reassessment after work-up.  CT and x-ray confirm a right sided 11th posterior right rib fracture.  This corresponds the patient's location of pain.  No other concerning findings discovered.  Patient will be given some spirometer and a dose of pain medicine and will be given prescription for pain medicine.  His vital signs are reassuring with no evidence of hypoxia or tachycardia.  Had a discussion with patient and family and given the reassuring vital signs, we do feel he safe for discharge home.  Patient will follow-up with PCP and understood extremely strict return precautions for development of pneumonia or worsened pain.  Patient no other questions or concerns and was discharged in good condition.         Final Clinical Impression(s) / ED Diagnoses Final diagnoses:  Closed fracture of one rib of right side, initial encounter    Rx / DC Orders ED Discharge Orders          Ordered     oxyCODONE-acetaminophen (PERCOCET/ROXICET) 5-325 MG tablet  Every 6 hours PRN        05/01/21 2146    lidocaine (LIDODERM) 5 %  Every 24 hours        05/01/21 2146            Clinical Impression: 1. Closed fracture of one rib of right side, initial encounter     Disposition: Discharge  Condition: Good  I have discussed the results, Dx and Tx plan with the pt(& family if present). He/she/they expressed understanding and agree(s) with the plan. Discharge instructions discussed at great length. Strict return precautions discussed and pt &/or family have verbalized understanding of the instructions. No further questions at time of discharge.    New Prescriptions   LIDOCAINE (LIDODERM) 5 %    Place 1 patch onto the skin daily. Remove & Discard patch within 12 hours or as directed by MD   OXYCODONE-ACETAMINOPHEN (PERCOCET/ROXICET) 5-325 MG TABLET    Take 1 tablet by mouth every 6 (six) hours as needed for severe pain.    Follow Up: No follow-up provider specified.    Jomarion Mish, Gwenyth Allegra, MD 05/01/21 2149

## 2021-05-01 NOTE — Discharge Instructions (Signed)
Your work-up today shows evidence of soft tissue injury and a rib fracture from your fall.  Please use the incentive spirometer and pain medicine and numbing patch to help with the symptoms and please follow-up with your primary doctor.  Your other imaging was reassuring with no acute deep or injuries.  If any symptoms change or worsen acutely, please return to the nearest emergency department.

## 2021-05-01 NOTE — Discharge Instructions (Signed)
Go to the emergency department as soon as you leave urgent care for further evaluation and management. 

## 2021-05-01 NOTE — ED Triage Notes (Signed)
Fell  4 days ago ,  tripped over Engineer, production . Woke up today to Right lower back pain radiating to right abdomen . Denies urinary symptoms .

## 2021-05-01 NOTE — ED Triage Notes (Signed)
Patient was seen on 04/28/21 having severe pain on his back more right sided.  Today he woke up with severe right side pain more to the side that moves around to the front.  Patient has taken Tylenol 500mg .

## 2021-05-01 NOTE — ED Notes (Signed)
RT educated pt on proper use of Incentive Spirometer. Pt goal set to 1500 MLs, pt able to achieve 2500 MLs. Pt able to perform w/out difficulty. Pt verbalizes understanding of process and teaching.

## 2021-05-01 NOTE — ED Provider Notes (Signed)
[facility]    CSN: [id number] Arrival date & time: 05/01/21  1412      History   Chief Complaint Chief Complaint  Patient presents with   Back Pain    HPI [name] is a 76 y.o. male.   Patient presents for further evaluation after a fall that occurred on 04/27/2021.  Patient reports that he tripped over his own feet and fell backwards onto the corner of a table hitting his lower back.  She was seen on 04/28/2021 and had a negative lumbar spine x-ray.  Patient reports that he woke up today with severe right-sided pain that is exacerbated with movement.  Patient has taken Tylenol with minimal improvement in symptoms.  Denies urinary burning, hematuria, urinary frequency, urinary or bowel incontinence, saddle anesthesia.   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 Authorizing Provider  acetaminophen (TYLENOL) 500 MG tablet Take 500 mg by mouth every 6  (six) hours as needed for mild pain.    Yes [provider]  aspirin 81 MG tablet Take 81 mg by mouth daily.    Yes [provider]  atorvastatin (LIPITOR) 80 MG tablet Take 80 mg by mouth daily. TAKE ONE-HALF TABLET BY MOUTH AT BEDTIME FOR CHOLESTEROL.   Yes [provider]  Cholecalciferol (VITAMIN D) 50 MCG (2000 UT) tablet Take 2,000 Units by mouth daily.  01/14/13  Yes [provider]  cyclobenzaprine (FLEXERIL) 5 MG tablet Take 1 tablet (5 mg total) by mouth at bedtime. 04/17/17  Yes Saguier, Percell Miller, PA-C  metoprolol (LOPRESSOR) 50 MG tablet Take 50 mg by mouth 2 (two) times daily.    Yes [provider]  Multiple Vitamins-Minerals (ICAPS PO) Take 1 tablet by mouth daily.    Yes [provider]  NITROSTAT 0.4 MG SL tablet Place 0.4 mg under the tongue every 5 (five) minutes as needed for chest pain.  07/27/10  Yes [provider]  omeprazole (PRILOSEC) 20 MG capsule Take 20 mg by mouth daily.   Yes [provider]  polyethylene glycol (MIRALAX / GLYCOLAX) packet Take 17 g by mouth daily as needed for mild constipation.    Yes [provider]  terazosin (HYTRIN) 5 MG capsule Take 5 mg by mouth at bedtime.   Yes [provider]  venlafaxine (EFFEXOR) 75 MG tablet Take 75 mg by  mouth daily.   Yes [provider]  vitamin B-12 (CYANOCOBALAMIN) 500 MCG tablet Take 500 mcg by mouth daily.   Yes [provider]    Family History Family History  Problem Relation Age of Onset   Arthritis Mother    Hypertension Mother    Cancer Mother        GYN type   Heart disease Father        CHF   Hypertension Father    Diverticulitis Father    Skin cancer Father    Hypertension Brother    Diabetes Maternal Grandmother    Colon cancer Paternal Grandmother    Cancer Paternal Grandmother    Hyperlipidemia Brother    Hyperlipidemia Brother    Hypertension Brother    Heart disease Brother 68        quadruple bypass, MI   Diverticulitis Brother    Heart disease Maternal Grandfather    Heart disease Paternal Grandfather    Hyperlipidemia Sister    Breast cancer Neg Hx    Prostate cancer Neg Hx     Social History Social History   Tobacco Use   Smoking status: Former    Packs/day: 0.25    Years: 2.00    Pack years: 0.50    Types: Cigarettes    Quit date: 09/22/1970    Years since quitting: 50.6   Smokeless tobacco: Never   Tobacco comments:    socially   Vaping Use   Vaping Use: Never used  Substance Use Topics   Alcohol use: No   Drug use: No     Allergies   Codeine and Penicillins   Review of Systems Review of Systems Per HPI  Physical Exam Triage Vital Signs ED Triage Vitals  Enc Vitals Group     BP 05/01/21 1451 (!) 150/82     Pulse Rate 05/01/21 1451 60     Resp 05/01/21 1451 18     Temp 05/01/21 1451 98.1 F (36.7 C)     Temp Source 05/01/21 1451 Oral     SpO2 05/01/21 1451 95 %     Weight 05/01/21 1452 199 lb 15.3 oz (90.7 kg)     Height 05/01/21 1452 5\' 11"  (1.803 m)     Head Circumference --      Peak Flow --      Pain Score 05/01/21 1451 9     Pain Loc --      Pain Edu? --      Excl. in [place]? --    No data found.  Updated Vital Signs BP (!) 150/82 (BP Location: Left Arm)    Pulse 60    Temp 98.1 F (36.7 C) (Oral)    Resp 18    Ht 5\' 11"  (1.803 m)    Wt 199 lb 15.3 oz (90.7 kg)    SpO2 95%    BMI 27.89 kg/m   Visual Acuity Right Eye Distance:   Left Eye Distance:   Bilateral Distance:    Right Eye Near:   Left Eye Near:    Bilateral Near:     Physical Exam Constitutional:      Appearance: Normal appearance.  HENT:     Head: Normocephalic and atraumatic.  Eyes:     Extraocular Movements: Extraocular movements intact.     Conjunctiva/sclera: Conjunctivae normal.  Pulmonary:     Effort: Pulmonary effort is normal.  Chest:     Chest wall: No tenderness.  Abdominal:  General: Bowel sounds are normal. There is no  distension.     Palpations: Abdomen is soft.     Tenderness: There is abdominal tenderness in the right lower quadrant.     Comments: Tenderness to palpation to right lower lumbar region that extends slightly to the right side.  No tenderness to rib cage.  Musculoskeletal:     Cervical back: Normal.     Thoracic back: Normal.     Lumbar back: Tenderness present.       Back:     Comments: Tenderness to palpation with associated mild bruising and mild abrasion located to right lower lumbar region at circled area on diagram.  Neurological:     General: No focal deficit present.     Mental Status: He is alert and oriented to person, place, and time. Mental status is at baseline.  Psychiatric:        Mood and Affect: Mood normal.        Behavior: Behavior normal.        Thought Content: Thought content normal.        Judgment: Judgment normal.     UC Treatments / Results  Labs (all labs ordered are listed, but only abnormal results are displayed) Labs Reviewed - No data to display  EKG   Radiology No results found.  Procedures Procedures (including critical care time)  Medications Ordered in UC Medications - No data to display  Initial Impression / Assessment and Plan / UC Course  I have reviewed the triage vital signs and the nursing notes.  Pertinent labs & imaging results that were available during my care of the patient were reviewed by me and considered in my medical decision making (see chart for details).     There is concern that there is abdominal pain after a fall.  I think that patient may need CT imaging and I cannot provide that here at the urgent care.  Patient was advised to go to the ER for further evaluation and management.  Patient was agreeable with plan.  Vital signs stable at discharge.  Agree with patient self transport to the hospital. Final Clinical Impressions(s) / UC Diagnoses   Final diagnoses:  Fall, initial encounter  Right lower quadrant  abdominal pain     Discharge Instructions      Go to the emergency department as soon as you leave urgent care for further evaluation and management.    ED Prescriptions   None    PDMP not reviewed this encounter.   Teodora Medici, [place] 05/01/21 [phone]

## 2021-05-03 LAB — URINE CULTURE: Culture: NO GROWTH

## 2023-02-20 ENCOUNTER — Encounter (HOSPITAL_BASED_OUTPATIENT_CLINIC_OR_DEPARTMENT_OTHER): Payer: Self-pay | Admitting: Student

## 2023-02-20 ENCOUNTER — Ambulatory Visit (INDEPENDENT_AMBULATORY_CARE_PROVIDER_SITE_OTHER): Payer: Medicare Other | Admitting: Student

## 2023-02-20 ENCOUNTER — Ambulatory Visit: Payer: No Typology Code available for payment source

## 2023-02-20 ENCOUNTER — Ambulatory Visit
Admission: EM | Admit: 2023-02-20 | Discharge: 2023-02-20 | Disposition: A | Discharge status: Home / Self Care | Payer: No Typology Code available for payment source

## 2023-02-20 DIAGNOSIS — C44222 Squamous cell carcinoma of skin of right ear and external auricular canal: Secondary | ICD-10-CM | POA: Insufficient documentation

## 2023-02-20 DIAGNOSIS — M79642 Pain in left hand: Secondary | ICD-10-CM | POA: Diagnosis not present

## 2023-02-20 DIAGNOSIS — K219 Gastro-esophageal reflux disease without esophagitis: Secondary | ICD-10-CM | POA: Insufficient documentation

## 2023-02-20 DIAGNOSIS — M25532 Pain in left wrist: Secondary | ICD-10-CM

## 2023-02-20 DIAGNOSIS — Z4889 Encounter for other specified surgical aftercare: Secondary | ICD-10-CM | POA: Insufficient documentation

## 2023-02-20 DIAGNOSIS — Z7189 Other specified counseling: Secondary | ICD-10-CM | POA: Insufficient documentation

## 2023-02-20 DIAGNOSIS — R351 Nocturia: Secondary | ICD-10-CM | POA: Insufficient documentation

## 2023-02-20 DIAGNOSIS — Z8679 Personal history of other diseases of the circulatory system: Secondary | ICD-10-CM | POA: Insufficient documentation

## 2023-02-20 DIAGNOSIS — L570 Actinic keratosis: Secondary | ICD-10-CM | POA: Insufficient documentation

## 2023-02-20 DIAGNOSIS — H5000 Unspecified esotropia: Secondary | ICD-10-CM | POA: Insufficient documentation

## 2023-02-20 DIAGNOSIS — D509 Iron deficiency anemia, unspecified: Secondary | ICD-10-CM | POA: Insufficient documentation

## 2023-02-20 DIAGNOSIS — K580 Irritable bowel syndrome with diarrhea: Secondary | ICD-10-CM | POA: Insufficient documentation

## 2023-02-20 DIAGNOSIS — K625 Hemorrhage of anus and rectum: Secondary | ICD-10-CM | POA: Insufficient documentation

## 2023-02-20 DIAGNOSIS — R232 Flushing: Secondary | ICD-10-CM | POA: Insufficient documentation

## 2023-02-20 DIAGNOSIS — S62102A Fracture of unspecified carpal bone, left wrist, initial encounter for closed fracture: Secondary | ICD-10-CM | POA: Diagnosis not present

## 2023-02-20 DIAGNOSIS — K036 Deposits [accretions] on teeth: Secondary | ICD-10-CM | POA: Insufficient documentation

## 2023-02-20 DIAGNOSIS — K08139 Complete loss of teeth due to caries, unspecified class: Secondary | ICD-10-CM | POA: Insufficient documentation

## 2023-02-20 DIAGNOSIS — H353190 Nonexudative age-related macular degeneration, unspecified eye, stage unspecified: Secondary | ICD-10-CM | POA: Insufficient documentation

## 2023-02-20 DIAGNOSIS — M792 Neuralgia and neuritis, unspecified: Secondary | ICD-10-CM | POA: Insufficient documentation

## 2023-02-20 DIAGNOSIS — Z800 Family history of malignant neoplasm of digestive organs: Secondary | ICD-10-CM | POA: Insufficient documentation

## 2023-02-20 DIAGNOSIS — Z961 Presence of intraocular lens: Secondary | ICD-10-CM | POA: Insufficient documentation

## 2023-02-20 DIAGNOSIS — B351 Tinea unguium: Secondary | ICD-10-CM | POA: Insufficient documentation

## 2023-02-20 DIAGNOSIS — H259 Unspecified age-related cataract: Secondary | ICD-10-CM | POA: Insufficient documentation

## 2023-02-20 DIAGNOSIS — I10 Essential (primary) hypertension: Secondary | ICD-10-CM | POA: Insufficient documentation

## 2023-02-20 DIAGNOSIS — Z0101 Encounter for examination of eyes and vision with abnormal findings: Secondary | ICD-10-CM | POA: Insufficient documentation

## 2023-02-20 DIAGNOSIS — S52532A Colles' fracture of left radius, initial encounter for closed fracture: Secondary | ICD-10-CM | POA: Diagnosis not present

## 2023-02-20 DIAGNOSIS — H02032 Senile entropion of right lower eyelid: Secondary | ICD-10-CM | POA: Insufficient documentation

## 2023-02-20 DIAGNOSIS — F5221 Male erectile disorder: Secondary | ICD-10-CM | POA: Insufficient documentation

## 2023-02-20 DIAGNOSIS — K08129 Complete loss of teeth due to periodontal diseases, unspecified class: Secondary | ICD-10-CM | POA: Insufficient documentation

## 2023-02-20 DIAGNOSIS — H353132 Nonexudative age-related macular degeneration, bilateral, intermediate dry stage: Secondary | ICD-10-CM | POA: Insufficient documentation

## 2023-02-20 DIAGNOSIS — Z01818 Encounter for other preprocedural examination: Secondary | ICD-10-CM | POA: Insufficient documentation

## 2023-02-20 DIAGNOSIS — I498 Other specified cardiac arrhythmias: Secondary | ICD-10-CM | POA: Insufficient documentation

## 2023-02-20 DIAGNOSIS — D485 Neoplasm of uncertain behavior of skin: Secondary | ICD-10-CM | POA: Insufficient documentation

## 2023-02-20 DIAGNOSIS — Z23 Encounter for immunization: Secondary | ICD-10-CM | POA: Insufficient documentation

## 2023-02-20 DIAGNOSIS — M8580 Other specified disorders of bone density and structure, unspecified site: Secondary | ICD-10-CM | POA: Insufficient documentation

## 2023-02-20 DIAGNOSIS — M810 Age-related osteoporosis without current pathological fracture: Secondary | ICD-10-CM | POA: Insufficient documentation

## 2023-02-20 DIAGNOSIS — H353211 Exudative age-related macular degeneration, right eye, with active choroidal neovascularization: Secondary | ICD-10-CM | POA: Insufficient documentation

## 2023-02-20 DIAGNOSIS — Z1389 Encounter for screening for other disorder: Secondary | ICD-10-CM | POA: Insufficient documentation

## 2023-02-20 DIAGNOSIS — G4730 Sleep apnea, unspecified: Secondary | ICD-10-CM | POA: Insufficient documentation

## 2023-02-20 DIAGNOSIS — R1013 Epigastric pain: Secondary | ICD-10-CM | POA: Insufficient documentation

## 2023-02-20 DIAGNOSIS — Z7729 Contact with and (suspected ) exposure to other hazardous substances: Secondary | ICD-10-CM | POA: Insufficient documentation

## 2023-02-20 DIAGNOSIS — K627 Radiation proctitis: Secondary | ICD-10-CM | POA: Insufficient documentation

## 2023-02-20 NOTE — Discharge Instructions (Signed)
  Please follow up with ortho after leaving urgent care office.

## 2023-02-20 NOTE — Progress Notes (Signed)
Chief Complaint: Left wrist pain     History of Present Illness:    [name] is a 77 y.o. right-hand-dominant male presenting today for evaluation of left wrist pain and swelling.  Patient sustained a FOOSH injury yesterday after he fell and braced his fall with his left hand.  His wrist was immediately painful and continued to swell so he did seek evaluation this morning in urgent care where he had x-rays taken and Ace wrap applied.  Rates pain today at a 9/10.  Denies any numbness or tingling.  Does have pain when moving the fingers.  Has taken Tylenol as well as used ice.  Denies any previous injury to this wrist.   Surgical History:   None  PMH/PSH/Family History/Social History/Meds/Allergies:    Past Medical History:  Diagnosis Date   Arrhythmia    palpitations   Depression    GERD (gastroesophageal reflux disease)    Hyperlipidemia    Hypertension    Prostate cancer (HCC)    Ulcer    Past Surgical History:  Procedure Laterality Date   EUS N/A 03/21/2013   Procedure: UPPER ENDOSCOPIC ULTRASOUND (EUS) LINEAR;  Surgeon: Theda Belfast, MD;  Location: [facility];  Service: Endoscopy;  Laterality: N/A;   HERNIA REPAIR  2001   LAPAROSCOPIC GASTROTOMY W/ REPAIR OF ULCER  1997   PROSTATE BIOPSY     VASECTOMY     Social History   Socioeconomic History   Marital status: Married    Spouse name: Arlene   Number of children: 2   Years of education: Not on file   Highest education level: Not on file  Occupational History   Occupation: retired    Associate Professor: [employer]  Tobacco Use   Smoking status: Former    Current packs/day: 0.00    Average packs/day: 0.3 packs/day for 2.0 years (0.5 ttl pk-yrs)    Types: Cigarettes    Start date: 09/21/1968    Quit date: 09/22/1970    Years since quitting: 52.4   Smokeless tobacco: Never   Tobacco comments:    socially   Vaping Use   Vaping status: Never Used  Substance and Sexual  Activity   Alcohol use: No   Drug use: No   Sexual activity: Yes    Partners: Female  Other Topics Concern   Not on file  Social History Narrative   Resides in [place].    Social Drivers of Corporate investment banker Strain: Not on file  Food Insecurity: Not on file  Transportation Needs: Not on file  Physical Activity: Not on file  Stress: Not on file  Social Connections: Not on file   Family History  Problem Relation Age of Onset   Arthritis Mother    Hypertension Mother    Cancer Mother        GYN type   Heart disease Father        CHF   Hypertension Father    Diverticulitis Father    Skin cancer Father    Hypertension Brother    Diabetes Maternal Grandmother    Colon cancer Paternal Grandmother    Cancer Paternal Grandmother    Hyperlipidemia Brother    Hyperlipidemia Brother    Hypertension Brother    Heart disease Brother 43  quadruple bypass, MI   Diverticulitis Brother    Heart disease Maternal Grandfather    Heart disease Paternal Grandfather    Hyperlipidemia Sister    Breast cancer Neg Hx    Prostate cancer Neg Hx    Allergies  Allergen Reactions   Codeine Nausea And Vomiting   Penicillin G     Other Reaction(s): Unknown   Penicillin V Rash   Penicillins Rash   Current Outpatient Medications  Medication Sig Dispense Refill   acetaminophen (TYLENOL) 500 MG tablet Take 500 mg by mouth every 6 (six) hours as needed for mild pain.      aspirin 81 MG tablet Take 81 mg by mouth daily.      atorvastatin (LIPITOR) 80 MG tablet Take 40 mg by mouth daily. TAKE ONE-HALF TABLET BY MOUTH AT BEDTIME FOR CHOLESTEROL.     calcium carbonate (TUMS - DOSED IN MG ELEMENTAL CALCIUM) 500 MG chewable tablet Chew 1 tablet by mouth daily.     Carboxymethylcellulose Sod PF (REFRESH PLUS) 0.5 % SOLN 1 drop 4 (four) times daily.     Cholecalciferol (VITAMIN D) 50 MCG (2000 UT) tablet Take 2,000 Units by mouth daily.      cyclobenzaprine (FLEXERIL) 5 MG tablet  Take 1 tablet (5 mg total) by mouth at bedtime. 7 tablet 0   diclofenac Sodium (VOLTAREN) 1 % GEL Apply 2 g topically 2 (two) times daily.     ferrous gluconate (FERGON) 324 MG tablet Take 324 mg by mouth 3 (three) times daily with meals.     lidocaine (LIDODERM) 5 % Place 1 patch onto the skin daily. Remove & Discard patch within 12 hours or as directed by MD 15 patch 0   mesalamine (CANASA) 1000 MG suppository Place 1,000 mg rectally at bedtime.     metoprolol (LOPRESSOR) 50 MG tablet Take 50 mg by mouth 2 (two) times daily.      Multiple Vitamins-Minerals (ICAPS PO) Take 1 tablet by mouth daily.      NITROSTAT 0.4 MG SL tablet Place 0.4 mg under the tongue every 5 (five) minutes as needed for chest pain.      nortriptyline (PAMELOR) 10 MG capsule Take 10 mg by mouth as directed.     omeprazole (PRILOSEC) 20 MG capsule Take 20 mg by mouth daily.     oxyCODONE-acetaminophen (PERCOCET/ROXICET) 5-325 MG tablet Take 1 tablet by mouth every 6 (six) hours as needed for severe pain. 15 tablet 0   pantoprazole (PROTONIX) 40 MG tablet Take 40 mg by mouth 2 (two) times daily before a meal.     polyethylene glycol (MIRALAX / GLYCOLAX) packet Take 17 g by mouth daily as needed for mild constipation.      scopolamine (TRANSDERM-SCOP) 1 MG/3DAYS Place 1 patch onto the skin every 3 (three) days.     sildenafil (VIAGRA) 100 MG tablet Take 100 mg by mouth as needed for erectile dysfunction.     terazosin (HYTRIN) 5 MG capsule Take 5 mg by mouth at bedtime.     terbinafine (LAMISIL) 1 % cream Apply 1 Application topically 2 (two) times daily.     venlafaxine (EFFEXOR) 75 MG tablet Take 75 mg by mouth daily.     vitamin B-12 (CYANOCOBALAMIN) 500 MCG tablet Take 500 mcg by mouth daily.     zoledronic acid (RECLAST) 5 MG/100ML SOLN injection Inject 5 mg into the vein once.     No current facility-administered medications for this visit.   DG Wrist Complete Left  Addendum Date: 02/20/2023 ADDENDUM REPORT:  02/20/2023 17:04 CLINICAL DATA:  Tripped over BOARDS and caught self with left hand bending back. Left wrist and hand pain. "Salter-Harris" should be omitted from IMPRESSION #1. This should read: IMPRESSION: 1. Acute mildly displaced and angulated fracture of the distal radius. Electronically Signed   By: Neita Garnet M.D.   On: 02/20/2023 17:04   Result Date: 02/20/2023 CLINICAL DATA:  Tripped over bars and caught self with left hand bending back. Left wrist and hand pain. EXAM: LEFT WRIST - COMPLETE 3+ VIEW; LEFT HAND - COMPLETE 3+ VIEW COMPARISON:  Left hand radiographs 04/10/2017 FINDINGS: Left wrist: There is diffuse decreased bone mineralization. There is transverse linear lucency within the distal radial metaphysis, physis, and epiphysis. Up to 2 mm lateral cortical step-off at the distal metaphysis on frontal view. Mild volar apex angulation of the fracture line on lateral view. Up to 2 mm dorsal displacement of a portion of the dorsal aspect of the distal radius on lateral view. No definite fracture line extension to the distal radial articular surface. 3 mm ulnar positive variance. Mild triscaphe and thumb carpometacarpal joint space narrowing, subchondral cystic change, and peripheral osteophytosis. Left hand: Moderate fifth DIP joint space narrowing. Mild joint space narrowing of the rest of the interphalangeal joints. Mild thumb metacarpal head osteophytes. Moderate wrist and proximal hand soft tissue swelling, greatest dorsally. IMPRESSION: 1. Acute mildly displaced and angulated Salter-Harris fracture of the distal radius. 2. Mild triscaphe and thumb carpometacarpal osteoarthritis. 3. Moderate fifth DIP joint space narrowing. Electronically Signed: By: Neita Garnet M.D. On: 02/20/2023 14:15   DG Hand Complete Left Addendum Date: 02/20/2023 ADDENDUM REPORT: 02/20/2023 17:04 CLINICAL DATA:  Tripped over BOARDS and caught self with left hand bending back. Left wrist and hand pain.  "Salter-Harris" should be omitted from IMPRESSION #1. This should read: IMPRESSION: 1. Acute mildly displaced and angulated fracture of the distal radius. Electronically Signed   By: Neita Garnet M.D.   On: 02/20/2023 17:04   Result Date: 02/20/2023 CLINICAL DATA:  Tripped over bars and caught self with left hand bending back. Left wrist and hand pain. EXAM: LEFT WRIST - COMPLETE 3+ VIEW; LEFT HAND - COMPLETE 3+ VIEW COMPARISON:  Left hand radiographs 04/10/2017 FINDINGS: Left wrist: There is diffuse decreased bone mineralization. There is transverse linear lucency within the distal radial metaphysis, physis, and epiphysis. Up to 2 mm lateral cortical step-off at the distal metaphysis on frontal view. Mild volar apex angulation of the fracture line on lateral view. Up to 2 mm dorsal displacement of a portion of the dorsal aspect of the distal radius on lateral view. No definite fracture line extension to the distal radial articular surface. 3 mm ulnar positive variance. Mild triscaphe and thumb carpometacarpal joint space narrowing, subchondral cystic change, and peripheral osteophytosis. Left hand: Moderate fifth DIP joint space narrowing. Mild joint space narrowing of the rest of the interphalangeal joints. Mild thumb metacarpal head osteophytes. Moderate wrist and proximal hand soft tissue swelling, greatest dorsally. IMPRESSION: 1. Acute mildly displaced and angulated Salter-Harris fracture of the distal radius. 2. Mild triscaphe and thumb carpometacarpal osteoarthritis. 3. Moderate fifth DIP joint space narrowing. Electronically Signed: By: Neita Garnet M.D. On: 02/20/2023 14:15    Review of Systems:   A ROS was performed including pertinent positives and negatives as documented in the HPI.  Physical Exam :   Constitutional: NAD and appears stated age Neurological: Alert and oriented Psych: Appropriate affect and cooperative There were no  vitals taken for this visit.   Comprehensive  Musculoskeletal Exam:    Left wrist exam demonstrates significant swelling extending from the wrist into the hand and fingers.  Mild ecchymosis present.  Active wrist range of motion limited due to swelling and pain.  Tenderness about the dorsal distal radius.  No pain proximally to the elbow.  Brisk capillary refill to all 5 fingers and radial pulses 2+.  Unable to fully form fist due to swelling.  Imaging:   Xray review from urgent care today (left wrist 3 views): Distal radius fracture with mild dorsal angulation.  Diffuse decreased bone mineralization.   I personally reviewed and interpreted the radiographs.   Assessment:   77 y.o. male with an acute left distal radius fracture after sustaining a FOOSH injury yesterday.  There is some angulation although this appears mild.  Given this, we will plan to proceed with immobilization in a removable wrist brace.  He can remove this for hygiene.  Also recommend continued icing to help manage swelling.  I do believe that this will heal well with conservative management.  I will plan to follow along as needed to ensure proper healing.  Will have him return in 4 weeks for follow-up and repeat x-ray.  Plan :    -Return to clinic in 4 weeks for reassessment and repeat x-ray     I personally saw and evaluated the patient, and participated in the management and treatment plan.  Hazle Nordmann, PA-C Orthopedics

## 2023-02-20 NOTE — ED Triage Notes (Signed)
Here with Wife. "I tripped over some boards and caught myself with left hand bending hand back hurting left wrist". Some pain, swelling, inability to move well .DOI: [id number] around "5pm". No abrasion. No laceration. No head injury.

## 2023-03-05 NOTE — ED Provider Notes (Signed)
[facility]    CSN: [id number] Arrival date & time: 02/20/23  1056      History   Chief Complaint Chief Complaint  Patient presents with   Fall   Wrist Pain    HPI [name] is a 77 y.o. male.   Patient here today for evaluation of left hand and wrist pain after he accidentally fell and caught hisself with same. He denies any abrasion or laceration. He did not sustain head injury with fall. He reports he tripped as the cause of the fall. He denies any numbness or tingling.   The history is provided by the patient.  Fall  Wrist Pain    Past Medical History:  Diagnosis Date   Arrhythmia    palpitations   Depression    GERD (gastroesophageal reflux disease)    Hyperlipidemia    Hypertension    Prostate cancer [facility])    Ulcer     Patient Active Problem List   Diagnosis Date Noted   Benign hypertension 02/20/2023   Complete loss of teeth due to caries 02/20/2023   Complete loss of teeth due to periodontal diseases, unspecified class 02/20/2023   Exposure to potentially hazardous substance 02/20/2023   History of cardiac arrhythmia 02/20/2023   Iron deficiency anemia 02/20/2023   Hot flash in male 02/20/2023   Actinic keratosis 02/20/2023   Age-related cataract of right eye 02/20/2023   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/20/2023   Radiation proctitis 02/20/2023   Deposits (accretions) on teeth 02/20/2023   Encounter for other specified surgical aftercare 02/20/2023   Epigastric pain 02/20/2023   Family history of colon cancer 02/20/2023   Hemorrhage of anus and rectum 02/20/2023   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   Male erectile disorder (CODE) 02/20/2023   Neoplasm of uncertain behavior of skin 02/20/2023   Neuralgia and neuritis, unspecified 02/20/2023   Nocturia 02/20/2023   Nonexudative age-related macular degeneration, bilateral, intermediate dry stage 02/20/2023   Exudative age-related macular degeneration, right eye, with  active choroidal neovascularization (HCC) 02/20/2023   Nonexudative age-related macular degeneration 02/20/2023   Age-related osteoporosis without current pathological fracture 02/20/2023   Osteoporosis 02/20/2023   Other specified cardiac arrhythmias 02/20/2023   Other specified disorders of bone density and structure, unspecified site 02/20/2023   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   Pseudophakia of left eye 02/20/2023   Senile entropion of right lower eyelid 02/20/2023   Sleep apnea 02/20/2023   Tinea unguium 02/20/2023   Unspecified esotropia 02/20/2023   Esotropia 02/20/2023   Gastro-esophageal reflux disease without esophagitis 02/20/2023   Gastroesophageal reflux disease 02/20/2023   Essential (primary) hypertension 02/20/2023   Encounter for examination of eyes and vision with abnormal findings 02/20/2023   Encounter for screening for other disorder 02/20/2023   Encounter for other preprocedural examination 02/20/2023   Other specified counseling 02/20/2023   Encounter for immunization 02/20/2023   Paraesophageal hernia 12/24/2019   GERD without esophagitis 11/26/2019   Hiatal hernia with GERD 11/26/2019   Malignant neoplasm of prostate (HCC) 02/19/2018   Macular degeneration of both eyes 05/15/2017   Nuclear sclerotic cataract of right eye 05/15/2017   Pseudophakia of both eyes 05/15/2017   Prostate cancer (HCC) 03/06/2017   Dysphagia 03/18/2012   ED (erectile dysfunction) 02/21/2012   Depression 02/21/2012   PTSD (post-traumatic stress disorder) 02/21/2012   Icterus 09/22/2010   Preventative health care 09/22/2010   Hyperlipidemia 09/22/2010   HTN (hypertension) 09/22/2010   GERD (gastroesophageal reflux disease)  09/22/2010   Gastric lesion 03/06/2009   History of colonoscopy 03/06/1998   Acute gastric ulcer with perforation (HCC) 03/07/1995    Past Surgical History:  Procedure Laterality Date   EUS N/A 03/21/2013   Procedure: UPPER  ENDOSCOPIC ULTRASOUND (EUS) LINEAR;  Surgeon: Theda Belfast, MD;  Location: [facility];  Service: Endoscopy;  Laterality: N/A;   HERNIA REPAIR  2001   LAPAROSCOPIC GASTROTOMY W/ REPAIR OF ULCER  1997   PROSTATE BIOPSY     VASECTOMY         Home Medications    Prior to Admission medications   Medication Sig Start Date End Date Taking? Authorizing Provider  atorvastatin (LIPITOR) 80 MG tablet Take 40 mg by mouth daily. TAKE ONE-HALF TABLET BY MOUTH AT BEDTIME FOR CHOLESTEROL.   Yes [provider]  Carboxymethylcellulose Sod PF (REFRESH PLUS) 0.5 % SOLN 1 drop 4 (four) times daily. 12/02/19  Yes [provider]  Cholecalciferol (VITAMIN D) 50 MCG (2000 UT) tablet Take 2,000 Units by mouth daily.  01/14/13  Yes [provider]  metoprolol (LOPRESSOR) 50 MG tablet Take 50 mg by mouth 2 (two) times daily.    Yes [provider]  Multiple Vitamins-Minerals (ICAPS PO) Take 1 tablet by mouth daily.    Yes [provider]  omeprazole (PRILOSEC) 20 MG capsule Take 20 mg by mouth daily.   Yes [provider]  terazosin (HYTRIN) 5 MG capsule Take 5 mg by mouth at bedtime.   Yes [provider]  venlafaxine (EFFEXOR) 75 MG tablet Take 75 mg by mouth daily.   Yes [provider]  vitamin B-12 (CYANOCOBALAMIN) 500 MCG tablet Take 500 mcg by mouth daily.   Yes [provider]  acetaminophen (TYLENOL) 500 MG tablet Take 500 mg by mouth every 6 (six) hours as needed for mild pain.     [provider]  aspirin 81 MG tablet Take 81 mg by mouth daily.     [provider]  calcium carbonate (TUMS - DOSED IN MG ELEMENTAL CALCIUM) 500 MG chewable tablet Chew 1 tablet by mouth daily.    [provider]  cyclobenzaprine (FLEXERIL) 5 MG tablet Take 1 tablet (5 mg total) by mouth at bedtime. 04/17/17   Saguier, Ramon Dredge, PA-C  diclofenac Sodium (VOLTAREN) 1 % GEL Apply 2 g topically 2 (two) times daily. 05/28/19    [provider]  ferrous gluconate (FERGON) 324 MG tablet Take 324 mg by mouth 3 (three) times daily with meals. 04/14/20   [provider]  lidocaine (LIDODERM) 5 % Place 1 patch onto the skin daily. Remove & Discard patch within 12 hours or as directed by MD 05/01/21   Tegeler, Canary Brim, MD  mesalamine (CANASA) 1000 MG suppository Place 1,000 mg rectally at bedtime. 10/09/19   [provider]  NITROSTAT 0.4 MG SL tablet Place 0.4 mg under the tongue every 5 (five) minutes as needed for chest pain.  07/27/10   [provider]  nortriptyline (PAMELOR) 10 MG capsule Take 10 mg by mouth as directed. 01/06/21   [provider]  oxyCODONE-acetaminophen (PERCOCET/ROXICET) 5-325 MG tablet Take 1 tablet by mouth every 6 (six) hours as needed for severe pain. 05/01/21   Tegeler, Canary Brim, MD  pantoprazole (PROTONIX) 40 MG tablet Take 40 mg by mouth 2 (two) times daily before a meal. 04/14/20   [provider]  polyethylene glycol (MIRALAX / GLYCOLAX) packet Take 17 g by mouth daily as needed for  mild constipation.     [provider]  scopolamine (TRANSDERM-SCOP) 1 MG/3DAYS Place 1 patch onto the skin every 3 (three) days. 12/27/19   [provider]  sildenafil (VIAGRA) 100 MG tablet Take 100 mg by mouth as needed for erectile dysfunction. 02/24/16   [provider]  terbinafine (LAMISIL) 1 % cream Apply 1 Application topically 2 (two) times daily. 06/01/20   [provider]  zoledronic acid (RECLAST) 5 MG/100ML SOLN injection Inject 5 mg into the vein once. 10/30/19   [provider]    Family History Family History  Problem Relation Age of Onset   Arthritis Mother    Hypertension Mother    Cancer Mother        GYN type   Heart disease Father        CHF   Hypertension Father    Diverticulitis Father    Skin cancer Father    Hypertension Brother    Diabetes Maternal Grandmother    Colon cancer  Paternal Grandmother    Cancer Paternal Grandmother    Hyperlipidemia Brother    Hyperlipidemia Brother    Hypertension Brother    Heart disease Brother 2       quadruple bypass, MI   Diverticulitis Brother    Heart disease Maternal Grandfather    Heart disease Paternal Grandfather    Hyperlipidemia Sister    Breast cancer Neg Hx    Prostate cancer Neg Hx     Social History Social History   Tobacco Use   Smoking status: Former    Current packs/day: 0.00    Average packs/day: 0.3 packs/day for 2.0 years (0.5 ttl pk-yrs)    Types: Cigarettes    Start date: 09/21/1968    Quit date: 09/22/1970    Years since quitting: 52.4   Smokeless tobacco: Never   Tobacco comments:    socially   Vaping Use   Vaping status: Never Used  Substance Use Topics   Alcohol use: No   Drug use: No     Allergies   Codeine, Penicillin g, Penicillin v, and Penicillins   Review of Systems Review of Systems  Constitutional:  Negative for chills and fever.  Eyes:  Negative for discharge and redness.  Musculoskeletal:  Positive for arthralgias.  Skin:  Negative for color change and wound.  Neurological:  Negative for numbness.     Physical Exam Triage Vital Signs ED Triage Vitals  Encounter Vitals Group     BP 02/20/23 1130 98/64     Systolic BP Percentile --      Diastolic BP Percentile --      Pulse Rate 02/20/23 1130 (!) 53     Resp 02/20/23 1130 18     Temp 02/20/23 1130 98.4 F (36.9 C)     Temp Source 02/20/23 1130 Oral     SpO2 02/20/23 1130 96 %     Weight 02/20/23 1125 189 lb (85.7 kg)     Height 02/20/23 1125 5\' 10"  (1.778 m)     Head Circumference --      Peak Flow --      Pain Score 02/20/23 1123 9     Pain Loc --      Pain Education --      Exclude from Growth Chart --    No data found.  Updated Vital Signs BP 98/64 (BP Location: Right Arm)   Pulse (!) 54   Temp 98.4 F (36.9 C) (Oral)   Resp 18  Ht 5\' 10"  (1.778 m)   Wt 189 lb (85.7 kg)   SpO2 96%   BMI  27.12 kg/m   Visual Acuity Right Eye Distance:   Left Eye Distance:   Bilateral Distance:    Right Eye Near:   Left Eye Near:    Bilateral Near:     Physical Exam Vitals and nursing note reviewed.  Constitutional:      General: He is not in acute distress.    Appearance: Normal appearance. He is not ill-appearing.  HENT:     Head: Normocephalic and atraumatic.  Eyes:     Conjunctiva/sclera: Conjunctivae normal.  Cardiovascular:     Rate and Rhythm: Normal rate.  Pulmonary:     Effort: Pulmonary effort is normal. No respiratory distress.  Musculoskeletal:     Comments: Mildly decreased ROM of left wrist due to pain  Skin:    Capillary Refill: Normal cap refill to left fingers Neurological:     Mental Status: He is alert.     Comments: Gross sensation intact to distal left fingers  Psychiatric:        Mood and Affect: Mood normal.        Behavior: Behavior normal.        Thought Content: Thought content normal.      UC Treatments / Results  Labs (all labs ordered are listed, but only abnormal results are displayed) Labs Reviewed - No data to display  EKG   Radiology No results found.  Procedures Procedures (including critical care time)  Medications Ordered in UC Medications - No data to display  Initial Impression / Assessment and Plan / UC Course  I have reviewed the triage vital signs and the nursing notes.  Pertinent labs & imaging results that were available during my care of the patient were reviewed by me and considered in my medical decision making (see chart for details).    Fracture noted on xray- recommended follow up with ortho same day for further evaluation and treatment. Patient is agreeable with same.   Final Clinical Impressions(s) / UC Diagnoses   Final diagnoses:  None     Discharge Instructions       Please follow up with ortho after leaving urgent care office.    ED Prescriptions   None    PDMP not reviewed this  encounter.   Tomi Bamberger, PA-C 03/05/23 2206

## 2023-03-20 ENCOUNTER — Ambulatory Visit (INDEPENDENT_AMBULATORY_CARE_PROVIDER_SITE_OTHER): Payer: Medicare Other

## 2023-03-20 ENCOUNTER — Ambulatory Visit (INDEPENDENT_AMBULATORY_CARE_PROVIDER_SITE_OTHER): Payer: Medicare Other | Admitting: Student

## 2023-03-20 DIAGNOSIS — S52532A Colles' fracture of left radius, initial encounter for closed fracture: Secondary | ICD-10-CM | POA: Diagnosis not present

## 2023-03-20 NOTE — Progress Notes (Signed)
 Chief Complaint: Left wrist pain     History of Present Illness:   03/20/23: Patient presents today for follow-up of his left wrist.  He has been compliant with use of the wrist brace.  Still having some pain which he rates at a 5/10 today particularly with movement and pronation and supination.  He is not able to squeeze a tight fist.  Does also note some discomfort around the ulnar proximal forearm which he describes as a nerve pain.  Denies any numbness or tingling.   [name] is a 78 y.o. right-hand-dominant male presenting today for evaluation of left wrist pain and swelling.  Patient sustained a FOOSH injury yesterday after he fell and braced his fall with his left hand.  His wrist was immediately painful and continued to swell so he did seek evaluation this morning in urgent care where he had x-rays taken and Ace wrap applied.  Rates pain today at a 9/10.  Denies any numbness or tingling.  Does have pain when moving the fingers.  Has taken Tylenol  as well as used ice.  Denies any previous injury to this wrist.   Surgical History:   None  PMH/PSH/Family History/Social History/Meds/Allergies:    Past Medical History:  Diagnosis Date   Arrhythmia    palpitations   Depression    GERD (gastroesophageal reflux disease)    Hyperlipidemia    Hypertension    Prostate cancer (HCC)    Ulcer    Past Surgical History:  Procedure Laterality Date   EUS N/A 03/21/2013   Procedure: UPPER ENDOSCOPIC ULTRASOUND (EUS) LINEAR;  Surgeon: Belvie JONETTA Just, MD;  Location: [facility];  Service: Endoscopy;  Laterality: N/A;   HERNIA REPAIR  2001   LAPAROSCOPIC GASTROTOMY W/ REPAIR OF ULCER  1997   PROSTATE BIOPSY     VASECTOMY     Social History   Socioeconomic History   Marital status: Married    Spouse name: Arlene   Number of children: 2   Years of education: Not on file   Highest education level: Not on file  Occupational History   Occupation:  retired    Associate Professor: [employer]  Tobacco Use   Smoking status: Former    Current packs/day: 0.00    Average packs/day: 0.3 packs/day for 2.0 years (0.5 ttl pk-yrs)    Types: Cigarettes    Start date: 09/21/1968    Quit date: 09/22/1970    Years since quitting: 52.5   Smokeless tobacco: Never   Tobacco comments:    socially   Vaping Use   Vaping status: Never Used  Substance and Sexual Activity   Alcohol use: No   Drug use: No   Sexual activity: Yes    Partners: Female  Other Topics Concern   Not on file  Social History Narrative   Resides in [place].    Social Drivers of Corporate Investment Banker Strain: Not on file  Food Insecurity: Not on file  Transportation Needs: Not on file  Physical Activity: Not on file  Stress: Not on file  Social Connections: Not on file   Family History  Problem Relation Age of Onset   Arthritis Mother    Hypertension Mother    Cancer Mother        GYN type   Heart  disease Father        CHF   Hypertension Father    Diverticulitis Father    Skin cancer Father    Hypertension Brother    Diabetes Maternal Grandmother    Colon cancer Paternal Grandmother    Cancer Paternal Grandmother    Hyperlipidemia Brother    Hyperlipidemia Brother    Hypertension Brother    Heart disease Brother 90       quadruple bypass, MI   Diverticulitis Brother    Heart disease Maternal Grandfather    Heart disease Paternal Grandfather    Hyperlipidemia Sister    Breast cancer Neg Hx    Prostate cancer Neg Hx    Allergies  Allergen Reactions   Codeine Nausea And Vomiting   Penicillin G     Other Reaction(s): Unknown   Penicillin V Rash   Penicillins Rash   Current Outpatient Medications  Medication Sig Dispense Refill   acetaminophen  (TYLENOL ) 500 MG tablet Take 500 mg by mouth every 6 (six) hours as needed for mild pain.      aspirin 81 MG tablet Take 81 mg by mouth daily.      atorvastatin (LIPITOR) 80 MG tablet Take 40 mg by mouth  daily. TAKE ONE-HALF TABLET BY MOUTH AT BEDTIME FOR CHOLESTEROL.     calcium carbonate (TUMS - DOSED IN MG ELEMENTAL CALCIUM) 500 MG chewable tablet Chew 1 tablet by mouth daily.     Carboxymethylcellulose Sod PF (REFRESH PLUS) 0.5 % SOLN 1 drop 4 (four) times daily.     Cholecalciferol (VITAMIN D) 50 MCG (2000 UT) tablet Take 2,000 Units by mouth daily.      cyclobenzaprine  (FLEXERIL ) 5 MG tablet Take 1 tablet (5 mg total) by mouth at bedtime. 7 tablet 0   diclofenac Sodium (VOLTAREN) 1 % GEL Apply 2 g topically 2 (two) times daily.     ferrous gluconate (FERGON) 324 MG tablet Take 324 mg by mouth 3 (three) times daily with meals.     lidocaine  (LIDODERM ) 5 % Place 1 patch onto the skin daily. Remove & Discard patch within 12 hours or as directed by MD 15 patch 0   mesalamine (CANASA) 1000 MG suppository Place 1,000 mg rectally at bedtime.     metoprolol (LOPRESSOR) 50 MG tablet Take 50 mg by mouth 2 (two) times daily.      Multiple Vitamins-Minerals (ICAPS PO) Take 1 tablet by mouth daily.      NITROSTAT 0.4 MG SL tablet Place 0.4 mg under the tongue every 5 (five) minutes as needed for chest pain.      nortriptyline (PAMELOR) 10 MG capsule Take 10 mg by mouth as directed.     omeprazole (PRILOSEC) 20 MG capsule Take 20 mg by mouth daily.     oxyCODONE -acetaminophen  (PERCOCET/ROXICET) 5-325 MG tablet Take 1 tablet by mouth every 6 (six) hours as needed for severe pain. 15 tablet 0   pantoprazole  (PROTONIX ) 40 MG tablet Take 40 mg by mouth 2 (two) times daily before a meal.     polyethylene glycol (MIRALAX / GLYCOLAX) packet Take 17 g by mouth daily as needed for mild constipation.      scopolamine (TRANSDERM-SCOP) 1 MG/3DAYS Place 1 patch onto the skin every 3 (three) days.     sildenafil (VIAGRA) 100 MG tablet Take 100 mg by mouth as needed for erectile dysfunction.     terazosin (HYTRIN) 5 MG capsule Take 5 mg by mouth at bedtime.     terbinafine (LAMISIL)  1 % cream Apply 1 Application  topically 2 (two) times daily.     venlafaxine (EFFEXOR) 75 MG tablet Take 75 mg by mouth daily.     vitamin B-12 (CYANOCOBALAMIN) 500 MCG tablet Take 500 mcg by mouth daily.     zoledronic acid (RECLAST) 5 MG/100ML SOLN injection Inject 5 mg into the vein once.     No current facility-administered medications for this visit.   No results found.   Review of Systems:   A ROS was performed including pertinent positives and negatives as documented in the HPI.  Physical Exam :   Constitutional: NAD and appears stated age Neurological: Alert and oriented Psych: Appropriate affect and cooperative There were no vitals taken for this visit.   Comprehensive Musculoskeletal Exam:    Exam of the left wrist demonstrates no obvious deformity.  Tenderness with palpation over the dorsal wrist and distal forearm particularly over the distal radius.  Able to form a full fist without any significant grip strength.  Full elbow ROM from 0 to 120 degrees.  Mild tenderness over the anterior ulnar aspect of the proximal forearm.  Radial pulse 2+.  Distal neurosensory exam intact.  Imaging:   Xray (left wrist 3 views): Mildly displaced distal radius fracture without any significant increase in callus formation.  No further displacement is seen.   I personally reviewed and interpreted the radiographs.  Assessment:   78 y.o. male 4 weeks status post mildly displaced Colles' fracture of the left wrist.  X-rays taken today do not show any significant changes compared to prior radiographs.  He does appear to be tolerating the wrist brace well however does continue to have some pain with motion.  Recommended limiting motion when possible particularly when this increases pain.  Will have him continue on current Tylenol  regimen for pain control.  He does note some pain in the proximal forearm particularly on the ulnar side that has been present since the injury.  After discussion, decided that we will continue to  monitor this and should this persist at next visit I would like to obtain x-rays of this area as well.  Plan :    -Return to clinic in 3 weeks for repeat x-ray and reassessment     I personally saw and evaluated the patient, and participated in the management and treatment plan.  Leonce Reveal, PA-C Orthopedics

## 2023-04-13 ENCOUNTER — Ambulatory Visit (INDEPENDENT_AMBULATORY_CARE_PROVIDER_SITE_OTHER): Payer: No Typology Code available for payment source | Admitting: Student

## 2023-04-13 ENCOUNTER — Ambulatory Visit (INDEPENDENT_AMBULATORY_CARE_PROVIDER_SITE_OTHER): Payer: No Typology Code available for payment source

## 2023-04-13 DIAGNOSIS — S52532A Colles' fracture of left radius, initial encounter for closed fracture: Secondary | ICD-10-CM | POA: Diagnosis not present

## 2023-04-13 NOTE — Progress Notes (Signed)
 Chief Complaint: Left wrist pain     History of Present Illness:   04/13/23: Patient is here today for follow-up of a left distal radius fracture.  He has been using the wrist brace consistently.  Overall he reports that pain levels are unchanged which is moderate.  Does have some soreness extending into his fingers as well as the ulnar aspect of the wrist.  Takes Tylenol  twice daily.  Reports inability to fully supinate his forearm.   03/20/23: Patient presents today for follow-up of his left wrist.  He has been compliant with use of the wrist brace.  Still having some pain which he rates at a 5/10 today particularly with movement and pronation and supination.  He is not able to squeeze a tight fist.  Does also note some discomfort around the ulnar proximal forearm which he describes as a nerve pain.  Denies any numbness or tingling.  Surgical History:   None  PMH/PSH/Family History/Social History/Meds/Allergies:    Past Medical History:  Diagnosis Date   Arrhythmia    palpitations   Depression    GERD (gastroesophageal reflux disease)    Hyperlipidemia    Hypertension    Prostate cancer (HCC)    Ulcer    Past Surgical History:  Procedure Laterality Date   EUS N/A 03/21/2013   Procedure: UPPER ENDOSCOPIC ULTRASOUND (EUS) LINEAR;  Surgeon: Belvie JONETTA Just, MD;  Location: [facility];  Service: Endoscopy;  Laterality: N/A;   HERNIA REPAIR  2001   LAPAROSCOPIC GASTROTOMY W/ REPAIR OF ULCER  1997   PROSTATE BIOPSY     VASECTOMY     Social History   Socioeconomic History   Marital status: Married    Spouse name: Arlene   Number of children: 2   Years of education: Not on file   Highest education level: Not on file  Occupational History   Occupation: retired    Associate Professor: [employer]  Tobacco Use   Smoking status: Former    Current packs/day: 0.00    Average packs/day: 0.3 packs/day for 2.0 years (0.5 ttl pk-yrs)    Types: Cigarettes     Start date: 09/21/1968    Quit date: 09/22/1970    Years since quitting: 52.5   Smokeless tobacco: Never   Tobacco comments:    socially   Vaping Use   Vaping status: Never Used  Substance and Sexual Activity   Alcohol use: No   Drug use: No   Sexual activity: Yes    Partners: Female  Other Topics Concern   Not on file  Social History Narrative   Resides in [place].    Social Drivers of Corporate Investment Banker Strain: Not on file  Food Insecurity: Not on file  Transportation Needs: Not on file  Physical Activity: Not on file  Stress: Not on file  Social Connections: Not on file   Family History  Problem Relation Age of Onset   Arthritis Mother    Hypertension Mother    Cancer Mother        GYN type   Heart disease Father        CHF   Hypertension Father    Diverticulitis Father    Skin cancer Father    Hypertension Brother    Diabetes Maternal Grandmother    Colon  cancer Paternal Grandmother    Cancer Paternal Grandmother    Hyperlipidemia Brother    Hyperlipidemia Brother    Hypertension Brother    Heart disease Brother 26       quadruple bypass, MI   Diverticulitis Brother    Heart disease Maternal Grandfather    Heart disease Paternal Grandfather    Hyperlipidemia Sister    Breast cancer Neg Hx    Prostate cancer Neg Hx    Allergies  Allergen Reactions   Codeine Nausea And Vomiting   Penicillin G     Other Reaction(s): Unknown   Penicillin V Rash   Penicillins Rash   Current Outpatient Medications  Medication Sig Dispense Refill   acetaminophen  (TYLENOL ) 500 MG tablet Take 500 mg by mouth every 6 (six) hours as needed for mild pain.      aspirin 81 MG tablet Take 81 mg by mouth daily.      atorvastatin (LIPITOR) 80 MG tablet Take 40 mg by mouth daily. TAKE ONE-HALF TABLET BY MOUTH AT BEDTIME FOR CHOLESTEROL.     calcium carbonate (TUMS - DOSED IN MG ELEMENTAL CALCIUM) 500 MG chewable tablet Chew 1 tablet by mouth daily.      Carboxymethylcellulose Sod PF (REFRESH PLUS) 0.5 % SOLN 1 drop 4 (four) times daily.     Cholecalciferol (VITAMIN D) 50 MCG (2000 UT) tablet Take 2,000 Units by mouth daily.      cyclobenzaprine  (FLEXERIL ) 5 MG tablet Take 1 tablet (5 mg total) by mouth at bedtime. 7 tablet 0   diclofenac Sodium (VOLTAREN) 1 % GEL Apply 2 g topically 2 (two) times daily.     ferrous gluconate (FERGON) 324 MG tablet Take 324 mg by mouth 3 (three) times daily with meals.     lidocaine  (LIDODERM ) 5 % Place 1 patch onto the skin daily. Remove & Discard patch within 12 hours or as directed by MD 15 patch 0   mesalamine (CANASA) 1000 MG suppository Place 1,000 mg rectally at bedtime.     metoprolol (LOPRESSOR) 50 MG tablet Take 50 mg by mouth 2 (two) times daily.      Multiple Vitamins-Minerals (ICAPS PO) Take 1 tablet by mouth daily.      NITROSTAT 0.4 MG SL tablet Place 0.4 mg under the tongue every 5 (five) minutes as needed for chest pain.      nortriptyline (PAMELOR) 10 MG capsule Take 10 mg by mouth as directed.     omeprazole (PRILOSEC) 20 MG capsule Take 20 mg by mouth daily.     oxyCODONE -acetaminophen  (PERCOCET/ROXICET) 5-325 MG tablet Take 1 tablet by mouth every 6 (six) hours as needed for severe pain. 15 tablet 0   pantoprazole  (PROTONIX ) 40 MG tablet Take 40 mg by mouth 2 (two) times daily before a meal.     polyethylene glycol (MIRALAX / GLYCOLAX) packet Take 17 g by mouth daily as needed for mild constipation.      scopolamine (TRANSDERM-SCOP) 1 MG/3DAYS Place 1 patch onto the skin every 3 (three) days.     sildenafil (VIAGRA) 100 MG tablet Take 100 mg by mouth as needed for erectile dysfunction.     terazosin (HYTRIN) 5 MG capsule Take 5 mg by mouth at bedtime.     terbinafine (LAMISIL) 1 % cream Apply 1 Application topically 2 (two) times daily.     venlafaxine (EFFEXOR) 75 MG tablet Take 75 mg by mouth daily.     vitamin B-12 (CYANOCOBALAMIN) 500 MCG tablet Take 500 mcg by  mouth daily.      zoledronic acid (RECLAST) 5 MG/100ML SOLN injection Inject 5 mg into the vein once.     No current facility-administered medications for this visit.   No results found.   Review of Systems:   A ROS was performed including pertinent positives and negatives as documented in the HPI.  Physical Exam :   Constitutional: NAD and appears stated age Neurological: Alert and oriented Psych: Appropriate affect and cooperative There were no vitals taken for this visit.   Comprehensive Musculoskeletal Exam:    Left wrist continues to demonstrate mild swelling without obvious deformity.  Tenderness along the dorsal aspect of the distal radius and over the ulnar aspect of the TFCC.  Patient is able to form a full fist with minimal strength.  Radial pulse 2+.  Imaging:   Xray (left wrist 3 views): Mildly displaced distal radius fracture with dorsal angulation.  Increased peripheral callus formation noted as well as slight improvement in fracture line visibility.    I personally reviewed and interpreted the radiographs.  Assessment:   78 y.o. male 7 weeks status post Colles' fracture of the left wrist.  There does appear to be some further evidence of healing on today's x-rays so I have recommended continued management with the brace.  Patient does have past history of osteoporosis which I discussed may delay healing.  He is experiencing stiffness in the wrist so I did discuss that we may consider addition of occupational therapy in the future for range of motion once adequate healing has been achieved.  Will plan to follow-up in another 4 weeks to assess healing at that point.  Plan :    -Return to clinic in 4 weeks for repeat x-ray and reassessment     I personally saw and evaluated the patient, and participated in the management and treatment plan.  Leonce Reveal, PA-C Orthopedics

## 2023-05-08 ENCOUNTER — Ambulatory Visit (INDEPENDENT_AMBULATORY_CARE_PROVIDER_SITE_OTHER)

## 2023-05-08 ENCOUNTER — Ambulatory Visit (HOSPITAL_BASED_OUTPATIENT_CLINIC_OR_DEPARTMENT_OTHER): Payer: No Typology Code available for payment source | Admitting: Student

## 2023-05-08 DIAGNOSIS — S52532A Colles' fracture of left radius, initial encounter for closed fracture: Secondary | ICD-10-CM | POA: Diagnosis not present

## 2023-05-08 DIAGNOSIS — M25832 Other specified joint disorders, left wrist: Secondary | ICD-10-CM | POA: Diagnosis not present

## 2023-05-08 NOTE — Progress Notes (Signed)
 Chief Complaint: Left wrist pain     History of Present Illness:   05/08/23: [name] is here today following up on a fracture of the left distal radius that occurred over 10 weeks ago.  He does report some continued mild to moderate pain as well as decreased range of motion.  He has been able to remove the splint for a few hours every day.  His pain is located mostly over the ulnar wrist up into the distal forearm and is worsened with wrist supination.   04/13/23: Patient is here today for follow-up of a left distal radius fracture.  He has been using the wrist brace consistently.  Overall he reports that pain levels are unchanged which is moderate.  Does have some soreness extending into his fingers as well as the ulnar aspect of the wrist.  Takes Tylenol twice daily.  Reports inability to fully supinate his forearm.  Surgical History:   None  PMH/PSH/Family History/Social History/Meds/Allergies:    Past Medical History:  Diagnosis Date   Arrhythmia    palpitations   Depression    GERD (gastroesophageal reflux disease)    Hyperlipidemia    Hypertension    Prostate cancer (HCC)    Ulcer    Past Surgical History:  Procedure Laterality Date   EUS N/A 03/21/2013   Procedure: UPPER ENDOSCOPIC ULTRASOUND (EUS) LINEAR;  Surgeon: Theda Belfast, MD;  Location: [facility];  Service: Endoscopy;  Laterality: N/A;   HERNIA REPAIR  2001   LAPAROSCOPIC GASTROTOMY W/ REPAIR OF ULCER  1997   PROSTATE BIOPSY     VASECTOMY     Social History   Socioeconomic History   Marital status: Married    Spouse name: Arlene   Number of children: 2   Years of education: Not on file   Highest education level: Not on file  Occupational History   Occupation: retired    Associate Professor: [employer]  Tobacco Use   Smoking status: Former    Current packs/day: 0.00    Average packs/day: 0.3 packs/day for 2.0 years (0.5 ttl pk-yrs)    Types: Cigarettes    Start date:  09/21/1968    Quit date: 09/22/1970    Years since quitting: 52.6   Smokeless tobacco: Never   Tobacco comments:    socially   Vaping Use   Vaping status: Never Used  Substance and Sexual Activity   Alcohol use: No   Drug use: No   Sexual activity: Yes    Partners: Female  Other Topics Concern   Not on file  Social History Narrative   Resides in [place].    Social Drivers of Corporate investment banker Strain: Not on file  Food Insecurity: Not on file  Transportation Needs: Not on file  Physical Activity: Not on file  Stress: Not on file  Social Connections: Not on file   Family History  Problem Relation Age of Onset   Arthritis Mother    Hypertension Mother    Cancer Mother        GYN type   Heart disease Father        CHF   Hypertension Father    Diverticulitis Father    Skin cancer Father    Hypertension Brother    Diabetes Maternal Grandmother    Colon  cancer Paternal Grandmother    Cancer Paternal Grandmother    Hyperlipidemia Brother    Hyperlipidemia Brother    Hypertension Brother    Heart disease Brother 88       quadruple bypass, MI   Diverticulitis Brother    Heart disease Maternal Grandfather    Heart disease Paternal Grandfather    Hyperlipidemia Sister    Breast cancer Neg Hx    Prostate cancer Neg Hx    Allergies  Allergen Reactions   Codeine Nausea And Vomiting   Penicillin G     Other Reaction(s): Unknown   Penicillin V Rash   Penicillins Rash   Current Outpatient Medications  Medication Sig Dispense Refill   acetaminophen (TYLENOL) 500 MG tablet Take 500 mg by mouth every 6 (six) hours as needed for mild pain.      aspirin 81 MG tablet Take 81 mg by mouth daily.      atorvastatin (LIPITOR) 80 MG tablet Take 40 mg by mouth daily. TAKE ONE-HALF TABLET BY MOUTH AT BEDTIME FOR CHOLESTEROL.     calcium carbonate (TUMS - DOSED IN MG ELEMENTAL CALCIUM) 500 MG chewable tablet Chew 1 tablet by mouth daily.     Carboxymethylcellulose Sod PF  (REFRESH PLUS) 0.5 % SOLN 1 drop 4 (four) times daily.     Cholecalciferol (VITAMIN D) 50 MCG (2000 UT) tablet Take 2,000 Units by mouth daily.      cyclobenzaprine (FLEXERIL) 5 MG tablet Take 1 tablet (5 mg total) by mouth at bedtime. 7 tablet 0   diclofenac Sodium (VOLTAREN) 1 % GEL Apply 2 g topically 2 (two) times daily.     ferrous gluconate (FERGON) 324 MG tablet Take 324 mg by mouth 3 (three) times daily with meals.     lidocaine (LIDODERM) 5 % Place 1 patch onto the skin daily. Remove & Discard patch within 12 hours or as directed by MD 15 patch 0   mesalamine (CANASA) 1000 MG suppository Place 1,000 mg rectally at bedtime.     metoprolol (LOPRESSOR) 50 MG tablet Take 50 mg by mouth 2 (two) times daily.      Multiple Vitamins-Minerals (ICAPS PO) Take 1 tablet by mouth daily.      NITROSTAT 0.4 MG SL tablet Place 0.4 mg under the tongue every 5 (five) minutes as needed for chest pain.      nortriptyline (PAMELOR) 10 MG capsule Take 10 mg by mouth as directed.     omeprazole (PRILOSEC) 20 MG capsule Take 20 mg by mouth daily.     oxyCODONE-acetaminophen (PERCOCET/ROXICET) 5-325 MG tablet Take 1 tablet by mouth every 6 (six) hours as needed for severe pain. 15 tablet 0   pantoprazole (PROTONIX) 40 MG tablet Take 40 mg by mouth 2 (two) times daily before a meal.     polyethylene glycol (MIRALAX / GLYCOLAX) packet Take 17 g by mouth daily as needed for mild constipation.      scopolamine (TRANSDERM-SCOP) 1 MG/3DAYS Place 1 patch onto the skin every 3 (three) days.     sildenafil (VIAGRA) 100 MG tablet Take 100 mg by mouth as needed for erectile dysfunction.     terazosin (HYTRIN) 5 MG capsule Take 5 mg by mouth at bedtime.     terbinafine (LAMISIL) 1 % cream Apply 1 Application topically 2 (two) times daily.     venlafaxine (EFFEXOR) 75 MG tablet Take 75 mg by mouth daily.     vitamin B-12 (CYANOCOBALAMIN) 500 MCG tablet Take 500 mcg by  mouth daily.     zoledronic acid (RECLAST) 5 MG/100ML  SOLN injection Inject 5 mg into the vein once.     No current facility-administered medications for this visit.   No results found.   Review of Systems:   A ROS was performed including pertinent positives and negatives as documented in the HPI.  Physical Exam :   Constitutional: NAD and appears stated age Neurological: Alert and oriented Psych: Appropriate affect and cooperative There were no vitals taken for this visit.   Comprehensive Musculoskeletal Exam:    Tenderness to palpation over the ulnar aspect of the wrist and distal forearm.  No pinpoint tenderness over the distal radius.  Active wrist range of motion limited to 20 degrees flexion and extension.  Full strength with resisted forearm pronation and supination however supination ROM continues to be limited.  Able to form a fist with light grip strength.  Radial pulse 2+.  Imaging:   Xray (left wrist 3 views): Continued healing and callus formation of distal radius fracture with mild dorsal displacement.  Positive ulnar variance with loss of space between ulna and lunate.   I personally reviewed and interpreted the radiographs.  Assessment:   78 y.o. right-hand-dominant male approximately 11 weeks status post Colles' fracture of the left wrist from a FOOSH injury.  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  At this point I do believe the majority of his symptoms is related to the ulnar abutment versus distal radius fracture given the location and nature of his pain.  Given this I would like to have him evaluated by our hand surgeon Dr. Denese Killings to determine if any other treatment modalities would be indicated.  Discussed that he can continue wearing the brace for added support but would encourage continued range of motion.  Plan :    -Referral to Dr. Denese Killings for follow-up of Colles' fracture and ulnar  abutment syndrome of left wrist to discuss further treatment plan -Return to clinic as needed     I personally saw and evaluated the patient, and participated in the management and treatment plan.  Hazle Nordmann, PA-C Orthopedics

## 2023-06-18 ENCOUNTER — Encounter: Admitting: Orthopedic Surgery

## 2024-01-07 ENCOUNTER — Encounter: Payer: Self-pay | Admitting: Radiology
